# Patient Record
Sex: Female | Born: 1985 | Race: Black or African American | Hispanic: No | Marital: Single | Smoking: Never smoker
Health system: Southern US, Community
[De-identification: ages and names within clinical notes are randomized; demographics above are authoritative.]

## PROBLEM LIST (undated history)

## (undated) DIAGNOSIS — Z8489 Family history of other specified conditions: Secondary | ICD-10-CM

## (undated) DIAGNOSIS — N390 Urinary tract infection, site not specified: Secondary | ICD-10-CM

## (undated) HISTORY — PX: WISDOM TOOTH EXTRACTION: SHX21

---

## 2009-03-16 ENCOUNTER — Inpatient Hospital Stay (HOSPITAL_COMMUNITY): Admission: AD | Admit: 2009-03-16 | Discharge: 2009-03-18 | Payer: Self-pay | Admitting: Obstetrics and Gynecology

## 2010-07-13 LAB — CBC
HCT: 43.6 % (ref 36.0–46.0)
Hemoglobin: 14.7 g/dL (ref 12.0–15.0)
MCHC: 33.5 g/dL (ref 30.0–36.0)
MCHC: 33.8 g/dL (ref 30.0–36.0)
MCV: 96.4 fL (ref 78.0–100.0)
MCV: 96.5 fL (ref 78.0–100.0)
Platelets: 156 10*3/uL (ref 150–400)
RDW: 13.9 % (ref 11.5–15.5)

## 2015-04-07 ENCOUNTER — Emergency Department (HOSPITAL_COMMUNITY)
Admission: EM | Admit: 2015-04-07 | Discharge: 2015-04-07 | Disposition: A | Discharge status: Home / Self Care | Payer: 59 | Attending: Emergency Medicine | Admitting: Emergency Medicine

## 2015-04-07 ENCOUNTER — Emergency Department (HOSPITAL_COMMUNITY): Payer: 59

## 2015-04-07 ENCOUNTER — Encounter (HOSPITAL_COMMUNITY): Payer: Self-pay | Admitting: Emergency Medicine

## 2015-04-07 DIAGNOSIS — Y9389 Activity, other specified: Secondary | ICD-10-CM | POA: Insufficient documentation

## 2015-04-07 DIAGNOSIS — S43402A Unspecified sprain of left shoulder joint, initial encounter: Secondary | ICD-10-CM | POA: Diagnosis not present

## 2015-04-07 DIAGNOSIS — Y998 Other external cause status: Secondary | ICD-10-CM | POA: Insufficient documentation

## 2015-04-07 DIAGNOSIS — Y92410 Unspecified street and highway as the place of occurrence of the external cause: Secondary | ICD-10-CM | POA: Insufficient documentation

## 2015-04-07 DIAGNOSIS — S199XXA Unspecified injury of neck, initial encounter: Secondary | ICD-10-CM | POA: Diagnosis present

## 2015-04-07 DIAGNOSIS — Z880 Allergy status to penicillin: Secondary | ICD-10-CM | POA: Insufficient documentation

## 2015-04-07 DIAGNOSIS — S161XXA Strain of muscle, fascia and tendon at neck level, initial encounter: Secondary | ICD-10-CM | POA: Insufficient documentation

## 2015-04-07 MED ORDER — METHOCARBAMOL 500 MG PO TABS: 500.0000 mg | ORAL_TABLET | Freq: Two times a day (BID) | ORAL | Status: DC

## 2015-04-07 MED ORDER — ACETAMINOPHEN 500 MG PO TABS
1000.0000 mg | ORAL_TABLET | Freq: Once | ORAL | Status: AC
  Administered 2015-04-07: 1000 mg via ORAL
  Filled 2015-04-07: qty 2
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
## 2015-04-07 NOTE — Discharge Instructions (Signed)
For pain control you may take up to  of Motrin (also known as ibuprofen). That is usually 4 over the counter pills,  3 times a day. Take with food to minimize stomach irritation   You can also take  tylenol (acetaminophen)  (this is 3 over the counter pills) four times a day. Do not drink alcohol or combine with other medications that have acetaminophen as an ingredient (Read the labels!).    For breakthrough pain you may take Robaxin. Do not drink alcohol, drive or operate heavy machinery when taking Robaxin.  Do not hesitate to return to the emergency room for any new, worsening or concerning symptoms.  Please obtain primary care using resource guide below. Let them know that you were seen in the emergency room and that they will need to obtain records for further outpatient management.   Cervical Sprain A cervical sprain is an injury in the neck in which the strong, fibrous tissues (ligaments) that connect your neck bones stretch or tear. Cervical sprains can range from mild to severe. Severe cervical sprains can cause the neck vertebrae to be unstable. This can lead to damage of the spinal cord and can result in serious nervous system problems. The amount of time it takes for a cervical sprain to get better depends on the cause and extent of the injury. Most cervical sprains heal in 1 to 3 weeks. CAUSES  Severe cervical sprains may be caused by:   Contact sport injuries (such as from football, rugby, wrestling, hockey, auto racing, gymnastics, diving, martial arts, or boxing).   Motor vehicle collisions.   Whiplash injuries. This is an injury from a sudden forward and backward whipping movement of the head and neck.  Falls.  Mild cervical sprains may be caused by:   Being in an awkward position, such as while cradling a telephone between your ear and shoulder.   Sitting in a chair that does not offer proper support.   Working at a poorly Marketing executive station.    Looking up or down for long periods of time.  SYMPTOMS   Pain, soreness, stiffness, or a burning sensation in the front, back, or sides of the neck. This discomfort may develop immediately after the injury or slowly, 24 hours or more after the injury.   Pain or tenderness directly in the middle of the back of the neck.   Shoulder or upper back pain.   Limited ability to move the neck.   Headache.   Dizziness.   Weakness, numbness, or tingling in the hands or arms.   Muscle spasms.   Difficulty swallowing or chewing.   Tenderness and swelling of the neck.  DIAGNOSIS  Most of the time your health care provider can diagnose a cervical sprain by taking your history and doing a physical exam. Your health care provider will ask about previous neck injuries and any known neck problems, such as arthritis in the neck. X-rays may be taken to find out if there are any other problems, such as with the bones of the neck. Other tests, such as a CT scan or MRI, may also be needed.  TREATMENT  Treatment depends on the severity of the cervical sprain. Mild sprains can be treated with rest, keeping the neck in place (immobilization), and pain medicines. Severe cervical sprains are immediately immobilized. Further treatment is done to help with pain, muscle spasms, and other symptoms and may include:  Medicines, such as pain relievers, numbing medicines, or muscle relaxants.  Physical therapy. This may involve stretching exercises, strengthening exercises, and posture training. Exercises and improved posture can help stabilize the neck, strengthen muscles, and help stop symptoms from returning.  HOME CARE INSTRUCTIONS   Put ice on the injured area.   Put ice in a plastic bag.   Place a towel between your skin and the bag.   Leave the ice on for 15-20 minutes, 3-4 times a day.   If your injury was severe, you may have been given a cervical collar to wear. A cervical collar  is a two-piece collar designed to keep your neck from moving while it heals.  Do not remove the collar unless instructed by your health care provider.  If you have long hair, keep it outside of the collar.  Ask your health care provider before making any adjustments to your collar. Minor adjustments may be required over time to improve comfort and reduce pressure on your chin or on the back of your head.  Ifyou are allowed to remove the collar for cleaning or bathing, follow your health care provider's instructions on how to do so safely.  Keep your collar clean by wiping it with mild soap and water and drying it completely. If the collar you have been given includes removable pads, remove them every 1-2 days and hand wash them with soap and water. Allow them to air dry. They should be completely dry before you wear them in the collar.  If you are allowed to remove the collar for cleaning and bathing, wash and dry the skin of your neck. Check your skin for irritation or sores. If you see any, tell your health care provider.  Do not drive while wearing the collar.   Only take over-the-counter or prescription medicines for pain, discomfort, or fever as directed by your health care provider.   Keep all follow-up appointments as directed by your health care provider.   Keep all physical therapy appointments as directed by your health care provider.   Make any needed adjustments to your workstation to promote good posture.   Avoid positions and activities that make your symptoms worse.   Warm up and stretch before being active to help prevent problems.  SEEK MEDICAL CARE IF:   Your pain is not controlled with medicine.   You are unable to decrease your pain medicine over time as planned.   Your activity level is not improving as expected.  SEEK IMMEDIATE MEDICAL CARE IF:   You develop any bleeding.  You develop stomach upset.  You have signs of an allergic reaction to  your medicine.   Your symptoms get worse.   You develop new, unexplained symptoms.   You have numbness, tingling, weakness, or paralysis in any part of your body.  MAKE SURE YOU:   Understand these instructions.  Will watch your condition.  Will get help right away if you are not doing well or get worse.   This information is not intended to replace advice given to you by your health care provider. Make sure you discuss any questions you have with your health care provider.   Document Released: 01/23/2007 Document Revised: 04/02/2013 Document Reviewed: 10/03/2012 Elsevier Interactive Patient Education 2016 [employer].   Emergency Department Resource Guide 1) Find a Doctor and Pay Out of Pocket Although you won't have to find out who is covered by your insurance plan, it is a good idea to ask around and get recommendations. You will then need to call the office  and see if the doctor you have chosen will accept you as a new patient and what types of options they offer for patients who are self-pay. Some doctors offer discounts or will set up payment plans for their patients who do not have insurance, but you will need to ask so you aren't surprised when you get to your appointment.  2) Contact Your Local Health Department Not all health departments have doctors that can see patients for sick visits, but many do, so it is worth a call to see if yours does. If you don't know where your local health department is, you can check in your phone book. The CDC also has a tool to help you locate your state's health department, and many state websites also have listings of all of their local health departments.  3) Find a Walk-in Clinic If your illness is not likely to be very severe or complicated, you may want to try a walk in clinic. These are popping up all over the country in pharmacies, drugstores, and shopping centers. They're usually staffed by nurse practitioners or physician  assistants that have been trained to treat common illnesses and complaints. They're usually fairly quick and inexpensive. However, if you have serious medical issues or chronic medical problems, these are probably not your best option.  No Primary Care Doctor: - Call Health Connect at  [phone] - they can help you locate a primary care doctor that  accepts your insurance, provides certain services, etc. - Physician Referral Service- [phone]  Chronic Pain Problems: Organization         Address  Phone   Notes  [facility]  [phone] Patients need to be referred by their primary care doctor.   Medication Assistance: Organization         Address  Phone   Notes  [facility] Medication [facility] [address] [place] [place], [place] [address] [phone] --Must be a resident of [facility] -- Must have NO insurance coverage whatsoever (no Medicaid/ Medicare, etc.) -- The pt. MUST have a primary care doctor that directs their care regularly and follows them in the community   [facility]  [phone]   [employer]  [phone]    Agencies that provide inexpensive medical care: Organization         Address  Phone   Notes  [facility]  [phone]   [facility]    [phone]   [facility] [address] [place], [place] [address] [phone]   [facility] [address] [place], [place] [phone]   Planned Parenthood    [phone]   [facility]    [phone]   [facility]  [address], [place] Phone:  [phone], Fax:  [phone] Hours of Operation:  9 am - 6 pm, M-F.  Also accepts Medicaid/Medicare and self-pay.  [facility]  [address], [place] Phone: [phone], Fax: [phone]. Hours of Operation:  8:30 am - 5:30 pm, M-F.  Also accepts  Medicaid and self-pay.  [facility] [place] [address], [place] Phone: [phone]   [facility] [address] Natasha Bence [place], [place] [phone], Ext. [phone] Mondays & Thursdays: 7-9 AM.  First 15 patients are seen on a first come, first serve basis.    Medicaid-accepting [place] Providers:  Organization         Address  Phone   Notes  [facility] [address], [place] [phone] Also accepts self-pay patients.  [facility] [address] Laurell Josephs [place], [place]  [phone]   [facility] [address], [place] [phone]   [facility] [address], [place] [phone]   Renaye Rakers [address], [place]   [phone] Only accepts [place] patients after they have their name applied to their card.   Self-Pay (no insurance) in [facility]:  Organization         Address  Phone   Notes  Sickle Cell Patients, [facility] [address] [place], [place] [phone]   [facility] [address] [place], [place] [phone]   [facility] [place]  [address], [place] [phone]   [facility]/Dr. Osei-Bonsu  [address], [place] or [address], [place] [phone] Phone number for both [place] and [place] locations is the same.  [facility]gent Medical and [facility] [address] [place] [phone]   [facility] [address], [place] or [address] [phone]   [facility] [address] [place] [phone], phone; [phone], fax Sees patients 1st and 3rd Saturday of every month.  Must not qualify for public or private insurance (i.e. Medicaid, Medicare, [place] Health Choice, Veterans' Benefits)  Household  income should be no more than 200% of the poverty level The clinic cannot treat you if you are pregnant or think you are pregnant  Sexually transmitted diseases are not treated at the clinic.    Dental Care: Organization         Address  Phone  Notes  [facility] of [facility] [address] [place], [place] [phone] Accepts children up to age 65 who are enrolled in [place] or [place] Health Choice; pregnant women with a Medicaid card; and children who have applied for Medicaid or [place] Health Choice, but were declined, whose parents can pay a reduced fee at time of service.  [facility] Department of [facility]  [address], [place] [phone] Accepts children up to age 81 who are enrolled in [place] or [place] Health Choice; pregnant women with a Medicaid card; and children who have applied for Medicaid or [place] Health Choice, but were declined, whose parents can pay a reduced fee at time of service.  [facility]  [address] [place], [place] [phone] Patients are seen by appointment only. Walk-ins are not accepted. [facility] will see patients 63 years of age and older. Monday - Tuesday (8am-5pm) Most Wednesdays (8:30-5pm) $30 per visit, cash only  [facility]  [address], [facility] [phone] Patients are seen by appointment only. Walk-ins are not accepted. [facility] will see patients 23 years of age and older. One Wednesday Evening (Monthly: Volunteer Based).  $30 per visit, cash only  [employer]  [phone] for adults; Children under age 42, call Graduate Pediatric Dentistry at [phone]. Children aged 65-14, please call [phone] to request a pediatric application.  Dental services are provided in all areas of dental care including fillings, crowns and bridges, complete and partial dentures, implants, gum  treatment, root canals, and extractions. Preventive care is also provided. Treatment is provided to both adults and children. Patients are selected via a lottery and there is often a waiting list.   [facility] [address], [place]  [phone] [email or web address]   [facility] [address], [place], [place] [phone], Ext. [phone] Second and Fourth Thursday of each month, opens at 6:30 AM; Clinic ends at 9 AM.  Patients are seen on a first-come first-served basis, and a limited number are seen during each clinic.   [facility]  [address], [place], [place] [phone]   Eligibility Requirements You must have lived in [place], [place], or [place] counties for at least the last three months.   You cannot be eligible for state or federal sponsored [place]healthcare insurance, including CIGNAVeterans Administration, [place]icaid, or [employer]icare.   You generally cannot be eligible for healthcare insurance through your employer.    How to apply: Eligibility screenings are held every Tuesday and Wednesday afternoon from 1:00 pm until 4:00 pm. You do not need an appointment for the interview!  [facility] [address], [place], [place] [phone]   [facility]  [phone]   [facility]  [phone]   [facility]  [phone]    Behavioral Health Resources in the Community: Intensive Outpatient Programs Organization         Address  Phone  Notes  [facility] [address], [place], [place] [phone]   [facility] [address], [place], [place] [phone]   [facility] [address], [place], [place]  [phone]   [facility] [address],  [place], [place] [phone] or [phone]   Substance Abuse Resources Organization         Address  Phone  Notes  Alcohol and  Drug Services  [phone]   [facility]  [phone]   [facility]  [phone]   [facility]  [phone]   Residential & Outpatient Substance Abuse Program  [phone]   Psychological Services Organization         Address  Phone  Notes  [facility]  [phone]- [phone]   [facility]  [phone]- [phone]   [facility] [address], [place] [phone] or [phone]    Mobile Crisis Teams Organization         Address  Phone  Notes  [facility], Mobile Crisis Care Unit  [phone]   [facility]  [address] [place], [place] [phone]   Doristine LocksSharon DeEsch [address] [place] [phone]    Self-Help/Support Groups Organization         Address  Phone             Notes  [facility] [place] - variety of support groups  336- [phone] Call for more information  Narcotics Anonymous (NA), [facility] [address], [place]  2 meetings at this location   Statisticianesidential Treatment Programs Organization         Address  Phone  Notes  [facility] [address],    [place]  [phone]   [facility]  [address], [place] [phone], [place], [place]  [phone]   [facility] [address] [place], [place] [phone] Admissions: 8am-3pm M-F  [facility] [address]    [place], [place] [phone]   [facility] [address] [place], [place], [place] [phone]   [facility] [address],  [place], [place] [phone]   [facility] - Intensive Outpatient [address] Laurell Josephs [address], [place], [place] [phone]   [facility] [address] [place]  [place], [place] [phone] or [phone]   [facility] ([facility]) [address] [place], [place] [phone] Accepts Medicaid  [facility]ship  [place] [address]  [place] [phone] Substance Abuse/Addiction Treatment   [facility] Organization         Address  Phone  Notes  [facility]  [phone]   Angie Fava, PhD [address] Ervin Knack [place], [place]   [phone] or [phone]   [facility]   [address] [place], [place] [phone]   [facility] [address] [place], [place] [phone] Insurance/Medicaid/sponsorship through [facility] and Families [address]                                    [place], [place] [phone] Therapy/tele-psych/case  [facility] [address], [place] [phone]    Dr. Lolly Mustache  [phone]   [facility]  [employer] [facility] Dept. 1) [address] [place] 2) [address] [place] 3)  [address], [place] [phone]  [phone]   [facility] Child Abuse Hotline 316-382-5951 or [phone] (After Hours)

## 2016-08-18 ENCOUNTER — Emergency Department (HOSPITAL_COMMUNITY)
Admission: EM | Admit: 2016-08-18 | Discharge: 2016-08-18 | Disposition: A | Discharge status: Home / Self Care | Payer: 59 | Attending: Emergency Medicine | Admitting: Emergency Medicine

## 2016-08-18 DIAGNOSIS — J020 Streptococcal pharyngitis: Secondary | ICD-10-CM

## 2016-08-18 DIAGNOSIS — J028 Acute pharyngitis due to other specified organisms: Secondary | ICD-10-CM | POA: Diagnosis not present

## 2016-08-18 DIAGNOSIS — B955 Unspecified streptococcus as the cause of diseases classified elsewhere: Secondary | ICD-10-CM | POA: Insufficient documentation

## 2016-08-18 DIAGNOSIS — J029 Acute pharyngitis, unspecified: Secondary | ICD-10-CM | POA: Diagnosis present

## 2016-08-18 LAB — RAPID STREP SCREEN (MED CTR MEBANE ONLY): Streptococcus, Group A Screen (Direct): POSITIVE — AB

## 2016-08-18 MED ORDER — MAGIC MOUTHWASH
5.0000 mL | Freq: Once | ORAL | Status: AC
  Administered 2016-08-18: 5 mL via ORAL
  Filled 2016-08-18: qty 5

## 2016-08-18 MED ORDER — AZITHROMYCIN 250 MG PO TABS: 250.0000 mg | ORAL_TABLET | Freq: Every day | ORAL | 0 refills | Status: DC

## 2016-08-18 MED ORDER — IBUPROFEN 400 MG PO TABS
600.0000 mg | ORAL_TABLET | Freq: Once | ORAL | Status: AC
  Administered 2016-08-18: 600 mg via ORAL
  Filled 2016-08-18: qty 1

## 2016-08-18 MED ORDER — IBUPROFEN 600 MG PO TABS: 600.0000 mg | ORAL_TABLET | Freq: Four times a day (QID) | ORAL | 0 refills | Status: DC | PRN

## 2016-08-18 MED ORDER — DEXAMETHASONE SODIUM PHOSPHATE 10 MG/ML IJ SOLN
10.0000 mg | Freq: Once | INTRAMUSCULAR | Status: AC
  Administered 2016-08-18: 10 mg via INTRAVENOUS
  Filled 2016-08-18: qty 1

## 2016-08-18 NOTE — Discharge Instructions (Signed)
You have a strep infection, treatment will be to finish the antibiotics.  Please return to the ER if your symptoms worsen; you have increased pain, fevers, chills, inability to keep any medications down, difficulty breathing. Otherwise see the outpatient doctor as requested.

## 2016-08-18 NOTE — ED Notes (Signed)
ED Provider at bedside. 

## 2016-08-22 NOTE — ED Provider Notes (Signed)
[facility]-EMERGENCY DEPT Provider Note   CSN: [id number] Arrival date & time: 08/18/16  0225     History   Chief Complaint No chief complaint on file.   HPI Linda Richards is a 31 y.o. female.  HPI SUBJECTIVE:  Linda Richards is a 31 y.o. female who complains of sore throat for 2 days. She denies a history of shortness of breath, vomiting, cough and sputum production and denies a history of asthma. Sore throat is worse with swallowing. No drooling, headaches, confusion and pain.   No past medical history on file.  There are no active problems to display for this patient.   No past surgical history on file.  OB History    No data available       Home Medications    Prior to Admission medications   Medication Sig Start Date End Date Taking? Authorizing Provider  azithromycin (ZITHROMAX) 250 MG tablet Take 1 tablet (250 mg total) by mouth daily. Take first 2 tablets together, then 1 every day until finished. 08/18/16   Derwood KaplanNanavati, Joniah Bednarski, MD  ibuprofen (ADVIL,MOTRIN) 600 MG tablet Take 1 tablet (600 mg total) by mouth every 6 (six) hours as needed. 08/18/16   Derwood KaplanNanavati, Naelani Lafrance, MD  methocarbamol (ROBAXIN) 500 MG tablet Take 1 tablet (500 mg total) by mouth 2 (two) times daily. 04/07/15   Pisciotta, Mardella LaymanNicole, PA-C    Family History No family history on file.  Social History Social History  Substance Use Topics  . Smoking status: Never Smoker  . Smokeless tobacco: Not on file  . Alcohol use No     Allergies   Penicillins   Review of Systems Review of Systems  Constitutional: Positive for activity change.  HENT: Positive for sore throat.   Allergic/Immunologic: Negative for immunocompromised state.  Neurological: Negative for weakness.     Physical Exam Updated Vital Signs BP 123/82   Pulse 89   SpO2 98%   Physical Exam  Constitutional: She is oriented to person, place, and time. She appears well-developed and well-nourished.  HENT:  Head: Normocephalic and  atraumatic.  Mouth/Throat: Oropharyngeal exudate present.  Pt has no trismus, stridor or crepitus over the neck. Pt has xxx tonsillar enlargement and exudates. Pharynx is erythematous.   Eyes: EOM are normal. Pupils are equal, round, and reactive to light.  Neck: Neck supple.  Cardiovascular: Normal rate, regular rhythm and normal heart sounds.   No murmur heard. Pulmonary/Chest: Effort normal. No respiratory distress.  Abdominal: Soft. She exhibits no distension. There is no tenderness. There is no rebound and no guarding.  Neurological: She is alert and oriented to person, place, and time.  Skin: Skin is warm and dry.  Nursing note and vitals reviewed.    ED Treatments / Results  Labs (all labs ordered are listed, but only abnormal results are displayed) Labs Reviewed  RAPID STRE[facility] SCREEN ([facility]) - Abnormal; Notable for the following:       Result Value   Streptococcus, Group A Screen (Direct) POSITIVE (*)    All other components within normal limits    EKG  EKG Interpretation None       Radiology No results found.  Procedures Procedures (including critical care time)  Medications Ordered in ED Medications  dexamethasone (DECADRON) injection 10 mg (10 mg Intravenous Given 08/18/16 0651)  ibuprofen (ADVIL,MOTRIN) tablet 600 mg (600 mg Oral Given 08/18/16 0651)  magic mouthwash (5 mLs Oral Given 08/18/16 [id number])     Initial Impression / Assessment and Plan /  ED Course  I have reviewed the triage vital signs and the nursing notes.  Pertinent labs & imaging results that were available during my care of the patient were reviewed by me and considered in my medical decision making (see chart for details).     Pt has strep pharyngitis based on exam and results. No signs of deep space infection.  Final Clinical Impressions(s) / ED Diagnoses   Final diagnoses:  Acute streptococcal pharyngitis    New Prescriptions Discharge Medication List as of 08/18/2016  7:01  AM    START taking these medications   Details  azithromycin (ZITHROMAX) 250 MG tablet Take 1 tablet (250 mg total) by mouth daily. Take first 2 tablets together, then 1 every day until finished., Starting Thu 08/18/2016, Print    ibuprofen (ADVIL,MOTRIN) 600 MG tablet Take 1 tablet (600 mg total) by mouth every 6 (six) hours as needed., Starting Thu 08/18/2016, Print         Derwood Kaplan, MD 08/22/16 1818
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
## 2018-11-26 ENCOUNTER — Encounter: Payer: Self-pay | Admitting: Internal Medicine

## 2018-11-26 ENCOUNTER — Ambulatory Visit (INDEPENDENT_AMBULATORY_CARE_PROVIDER_SITE_OTHER): Payer: 59 | Admitting: Internal Medicine

## 2018-11-26 ENCOUNTER — Other Ambulatory Visit: Payer: Self-pay

## 2018-11-26 VITALS — BP 112/66 | HR 77 | Temp 98.2°F | Ht 63.4 in | Wt 212.4 lb

## 2018-11-26 DIAGNOSIS — Z0000 Encounter for general adult medical examination without abnormal findings: Secondary | ICD-10-CM

## 2018-11-26 LAB — POCT URINALYSIS DIPSTICK
Bilirubin, UA: NEGATIVE
Blood, UA: NEGATIVE
Glucose, UA: NEGATIVE
Ketones, UA: NEGATIVE
Leukocytes, UA: NEGATIVE
Nitrite, UA: NEGATIVE
Protein, UA: NEGATIVE
Spec Grav, UA: 1.02 (ref 1.010–1.025)
Urobilinogen, UA: 0.2 E.U./dL
pH, UA: 5.5 (ref 5.0–8.0)
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
## 2018-11-26 NOTE — Patient Instructions (Signed)
Health Maintenance, Female Adopting a healthy lifestyle and getting preventive care are important in promoting health and wellness. Ask your health care provider about:  The right schedule for you to have regular tests and exams.  Things you can do on your own to prevent diseases and keep yourself healthy. What should I know about diet, weight, and exercise? Eat a healthy diet   Eat a diet that includes plenty of vegetables, fruits, low-fat dairy products, and lean protein.  Do not eat a lot of foods that are high in solid fats, added sugars, or sodium. Maintain a healthy weight Body mass index (BMI) is used to identify weight problems. It estimates body fat based on height and weight. Your health care provider can help determine your BMI and help you achieve or maintain a healthy weight. Get regular exercise Get regular exercise. This is one of the most important things you can do for your health. Most adults should:  Exercise for at least 150 minutes each week. The exercise should increase your heart rate and make you sweat (moderate-intensity exercise).  Do strengthening exercises at least twice a week. This is in addition to the moderate-intensity exercise.  Spend less time sitting. Even light physical activity can be beneficial. Watch cholesterol and blood lipids Have your blood tested for lipids and cholesterol at 32 years of age, then have this test every 5 years. Have your cholesterol levels checked more often if:  Your lipid or cholesterol levels are high.  You are older than 32 years of age.  You are at high risk for heart disease. What should I know about cancer screening? Depending on your health history and family history, you may need to have cancer screening at various ages. This may include screening for:  Breast cancer.  Cervical cancer.  Colorectal cancer.  Skin cancer.  Lung cancer. What should I know about heart disease, diabetes, and high blood  pressure? Blood pressure and heart disease  High blood pressure causes heart disease and increases the risk of stroke. This is more likely to develop in people who have high blood pressure readings, are of African descent, or are overweight.  Have your blood pressure checked: ? Every 3-5 years if you are 18-39 years of age. ? Every year if you are 40 years old or older. Diabetes Have regular diabetes screenings. This checks your fasting blood sugar level. Have the screening done:  Once every three years after age 40 if you are at a normal weight and have a low risk for diabetes.  More often and at a younger age if you are overweight or have a high risk for diabetes. What should I know about preventing infection? Hepatitis B If you have a higher risk for hepatitis B, you should be screened for this virus. Talk with your health care provider to find out if you are at risk for hepatitis B infection. Hepatitis C Testing is recommended for:  Everyone born from 1945 through 1965.  Anyone with known risk factors for hepatitis C. Sexually transmitted infections (STIs)  Get screened for STIs, including gonorrhea and chlamydia, if: ? You are sexually active and are younger than 32 years of age. ? You are older than 32 years of age and your health care provider tells you that you are at risk for this type of infection. ? Your sexual activity has changed since you were last screened, and you are at increased risk for chlamydia or gonorrhea. Ask your health care provider if   you are at risk.  Ask your health care provider about whether you are at high risk for HIV. Your health care provider may recommend a prescription medicine to help prevent HIV infection. If you choose to take medicine to prevent HIV, you should first get tested for HIV. You should then be tested every 3 months for as long as you are taking the medicine. Pregnancy  If you are about to stop having your period (premenopausal) and  you may become pregnant, seek counseling before you get pregnant.  Take 400 to 800 micrograms (mcg) of folic acid every day if you become pregnant.  Ask for birth control (contraception) if you want to prevent pregnancy. Osteoporosis and menopause Osteoporosis is a disease in which the bones lose minerals and strength with aging. This can result in bone fractures. If you are 65 years old or older, or if you are at risk for osteoporosis and fractures, ask your health care provider if you should:  Be screened for bone loss.  Take a calcium or vitamin D supplement to lower your risk of fractures.  Be given hormone replacement therapy (HRT) to treat symptoms of menopause. Follow these instructions at home: Lifestyle  Do not use any products that contain nicotine or tobacco, such as cigarettes, e-cigarettes, and chewing tobacco. If you need help quitting, ask your health care provider.  Do not use street drugs.  Do not share needles.  Ask your health care provider for help if you need support or information about quitting drugs. Alcohol use  Do not drink alcohol if: ? Your health care provider tells you not to drink. ? You are pregnant, may be pregnant, or are planning to become pregnant.  If you drink alcohol: ? Limit how much you use to 0-1 drink a day. ? Limit intake if you are breastfeeding.  Be aware of how much alcohol is in your drink. In the [place] one drink equals one 12 oz bottle of beer (355 mL), one 5 oz glass of wine (148 mL), or one 1 oz glass of hard liquor (44 mL). General instructions  Schedule regular health, dental, and eye exams.  Stay current with your vaccines.  Tell your health care provider if: ? You often feel depressed. ? You have ever been abused or do not feel safe at home. Summary  Adopting a healthy lifestyle and getting preventive care are important in promoting health and wellness.  Follow your health care provider's instructions about healthy  diet, exercising, and getting tested or screened for diseases.  Follow your health care provider's instructions on monitoring your cholesterol and blood pressure. This information is not intended to replace advice given to you by your health care provider. Make sure you discuss any questions you have with your health care provider. Document Released: 10/11/2010 Document Revised: 03/21/2018 Document Reviewed: 03/21/2018 Elsevier Patient Education  2020 [employer] Inc.  

## 2018-11-27 LAB — CBC
Hematocrit: 41.5 % (ref 34.0–46.6)
Hemoglobin: 14.7 g/dL (ref 11.1–15.9)
MCH: 32.3 pg (ref 26.6–33.0)
MCHC: 35.4 g/dL (ref 31.5–35.7)
MCV: 91 fL (ref 79–97)
Platelets: 245 10*3/uL (ref 150–450)
RBC: 4.55 x10E6/uL (ref 3.77–5.28)
RDW: 12.5 % (ref 11.7–15.4)
WBC: 7.5 10*3/uL (ref 3.4–10.8)

## 2018-11-27 LAB — CMP14+EGFR
ALT: 18 IU/L (ref 0–32)
AST: 23 IU/L (ref 0–40)
Albumin/Globulin Ratio: 1.4 (ref 1.2–2.2)
Albumin: 4.2 g/dL (ref 3.8–4.8)
Alkaline Phosphatase: 55 IU/L (ref 39–117)
BUN/Creatinine Ratio: 10 (ref 9–23)
BUN: 12 mg/dL (ref 6–20)
Bilirubin Total: 0.5 mg/dL (ref 0.0–1.2)
CO2: 21 mmol/L (ref 20–29)
Calcium: 9.4 mg/dL (ref 8.7–10.2)
Chloride: 101 mmol/L (ref 96–106)
Creatinine, Ser: 1.16 mg/dL — ABNORMAL HIGH (ref 0.57–1.00)
GFR calc Af Amer: 72 mL/min/{1.73_m2} (ref >59)
GFR calc non Af Amer: 62 mL/min/{1.73_m2} (ref >59)
Globulin, Total: 2.9 g/dL (ref 1.5–4.5)
Glucose: 77 mg/dL (ref 65–99)
Potassium: 3.7 mmol/L (ref 3.5–5.2)
Sodium: 138 mmol/L (ref 134–144)
Total Protein: 7.1 g/dL (ref 6.0–8.5)

## 2018-11-27 LAB — LIPID PANEL
Chol/HDL Ratio: 3.6 ratio (ref 0.0–4.4)
Cholesterol, Total: 167 mg/dL (ref 100–199)
HDL: 47 mg/dL (ref >39)
LDL Calculated: 97 mg/dL (ref 0–99)
Triglycerides: 114 mg/dL (ref 0–149)
VLDL Cholesterol Cal: 23 mg/dL (ref 5–40)

## 2018-11-27 LAB — HEMOGLOBIN A1C
Est. average glucose Bld gHb Est-mCnc: 111 mg/dL
Hgb A1c MFr Bld: 5.5 % (ref 4.8–5.6)

## 2018-11-27 LAB — VITAMIN D 25 HYDROXY (VIT D DEFICIENCY, FRACTURES): Vit D, 25-Hydroxy: 39.1 ng/mL (ref 30.0–100.0)

## 2018-11-27 LAB — TSH: TSH: 3.49 u[IU]/mL (ref 0.450–4.500)

## 2019-06-02 ENCOUNTER — Emergency Department (HOSPITAL_COMMUNITY)
Admission: EM | Admit: 2019-06-02 | Discharge: 2019-06-02 | Disposition: A | Discharge status: Home / Self Care | Payer: No Typology Code available for payment source | Attending: Emergency Medicine | Admitting: Emergency Medicine

## 2019-06-02 ENCOUNTER — Other Ambulatory Visit: Payer: Self-pay

## 2019-06-02 DIAGNOSIS — R2241 Localized swelling, mass and lump, right lower limb: Secondary | ICD-10-CM | POA: Insufficient documentation

## 2019-06-02 DIAGNOSIS — M79604 Pain in right leg: Secondary | ICD-10-CM | POA: Insufficient documentation

## 2019-06-02 LAB — D-DIMER, QUANTITATIVE: D-Dimer, Quant: 0.31 ug/mL-FEU (ref 0.00–0.50)

## 2019-06-02 NOTE — Discharge Instructions (Signed)
It was a pleasure meeting you tonight. Fortunately, your D-dimer returned and was negative indicating that your leg pain is very unlikely to be related to a blood clot. As we discussed, you can try ibuprofen, tylenol and heat on your leg if you are experiencing pain. If you develop swelling, worse pain, or redness of your leg, please be re-evaluated.

## 2019-06-02 NOTE — ED Provider Notes (Signed)
[facility] EMERGENCY DEPARTMENT Provider Note   CSN: [id number] Arrival date & time: 06/02/19  0217     History Chief Complaint  Patient presents with  . Leg Pain    Linda Richards is a 34 y.o. female with no significant past medical history who is presenting tonight for right leg pain.  She states that it started in her thigh yesterday and migrated down to her calf today.  The thigh pains no longer present.   She denies any recent trauma to the leg.  No prior history of blood clots.  Her father did have a DVT and pulmonary embolism in his 36s that was unprovoked.  Not currently taking any medications. She denies cough or shortness of breath.  No past medical history on file.  There are no problems to display for this patient.   No past surgical history on file.   OB History   No obstetric history on file.     Family History  Problem Relation Age of Onset  . Diabetes Mother   . Diabetes Father   . Hypertension Father     Social History   Tobacco Use  . Smoking status: Never Smoker  . Smokeless tobacco: Never Used  Substance Use Topics  . Alcohol use: Yes    Comment: social  . Drug use: No    Home Medications Prior to Admission medications   Not on File    Allergies    Penicillins  Review of Systems   Review of Systems  Constitutional: Negative.   HENT: Negative.   Respiratory: Negative for cough and shortness of breath.   Cardiovascular: Positive for leg swelling. Negative for chest pain.  Gastrointestinal: Negative.   Genitourinary: Negative.   Musculoskeletal:       Right lower leg pain  Skin: Negative.   Neurological: Negative.   Psychiatric/Behavioral: Negative.     Physical Exam Updated Vital Signs BP 119/80 (BP Location: Right Arm)   Pulse 98   Temp 98.5 F (36.9 C) (Oral)   Resp 18   SpO2 98%   Physical Exam Constitutional:      General: She is not in acute distress. HENT:     Head: Atraumatic.    Cardiovascular:     Rate and Rhythm: Normal rate and regular rhythm.     Comments: Pedal pulses intact.  Pulmonary:     Effort: Pulmonary effort is normal. No respiratory distress.     Breath sounds: Normal breath sounds.  Abdominal:     General: Bowel sounds are normal.  Musculoskeletal:     Comments: Mild lower extremity non-pitting edema bilaterally. Pain with palpation over the right calf. Negative homan's sign.  Skin:    General: Skin is warm and dry.     Findings: No bruising or erythema.  Neurological:     General: No focal deficit present.     Mental Status: She is alert.  Psychiatric:        Mood and Affect: Mood normal.     ED Results / Procedures / Treatments   Labs (all labs ordered are listed, but only abnormal results are displayed) Labs Reviewed  D-DIMER, QUANTITATIVE (NOT AT [facility])    EKG None  Radiology No results found.  Medications Ordered in ED Medications - No data to display  ED Course  I have reviewed the triage vital signs and the nursing notes.  Pertinent labs & imaging results that were available during my care of the patient were reviewed  by me and considered in my medical decision making (see chart for details).    MDM Rules/Calculators/A&P  34 year old healthy female with no significant past medical history who is presenting with 1 day history of right leg pain.  Naturally, DVT is of concern. Her FH of her father having an unprovoked clot in his 44s contributes as well. She has no history of clot herself. Low probability based on Well's score of 1. Will obtain a D-dimer. If negative, will likely be able to discharge with conservative measures. Other consideration would be an arterial occlusion however pedal pulses are present so very unlikely. No trauma or activity out of her routine to raise concern for an MSK or compartment syndrome.  4:16 AM D-dimer 0.31. Discussed result and discharge with patient. Educated regarding conservative  measures. She implies agreement with the plan and has no further questions.  Final Clinical Impression(s) / ED Diagnoses Final diagnoses:  Right leg pain    Rx / DC Orders ED Discharge Orders    None       Mitzi Hansen, MD 06/02/19 2094    Merrily Pew, MD 06/02/19 [phone]

## 2019-06-02 NOTE — ED Notes (Signed)
Patient verbalizes understanding of discharge instructions. Opportunity for questioning and answers were provided. Armband removed by staff, pt discharged from ED.  

## 2019-06-02 NOTE — ED Triage Notes (Signed)
Per pt she has been having right leg pain x 2 days. Pt said no swelling or redness just a tender area that started in her thigh and now in the right calf.

## 2019-12-02 ENCOUNTER — Other Ambulatory Visit: Payer: Self-pay

## 2019-12-02 ENCOUNTER — Ambulatory Visit (INDEPENDENT_AMBULATORY_CARE_PROVIDER_SITE_OTHER): Payer: No Typology Code available for payment source | Admitting: Internal Medicine

## 2019-12-02 ENCOUNTER — Encounter: Payer: Self-pay | Admitting: Internal Medicine

## 2019-12-02 VITALS — BP 112/68 | HR 102 | Temp 98.1°F | Ht 62.2 in | Wt 232.2 lb

## 2019-12-02 DIAGNOSIS — Z6841 Body Mass Index (BMI) 40.0 and over, adult: Secondary | ICD-10-CM | POA: Diagnosis not present

## 2019-12-02 DIAGNOSIS — M545 Low back pain, unspecified: Secondary | ICD-10-CM

## 2019-12-02 DIAGNOSIS — Z0000 Encounter for general adult medical examination without abnormal findings: Secondary | ICD-10-CM

## 2019-12-02 DIAGNOSIS — G8929 Other chronic pain: Secondary | ICD-10-CM

## 2019-12-02 DIAGNOSIS — N62 Hypertrophy of breast: Secondary | ICD-10-CM | POA: Diagnosis not present

## 2019-12-02 NOTE — Progress Notes (Signed)
I,Katawbba Wiggins,acting as a Education administrator for Maximino Greenland, MD.,have documented all relevant documentation on the behalf of Maximino Greenland, MD,as directed by  Maximino Greenland, MD while in the presence of Maximino Greenland, MD.  This visit occurred during the SARS-CoV-2 public health emergency.  Safety protocols were in place, including screening questions prior to the visit, additional usage of staff PPE, and extensive cleaning of exam room while observing appropriate contact time as indicated for disinfecting solutions.  Subjective:     Patient ID: Linda Richards , female    DOB: 09-27-1985 , 34 y.o.   MRN: [id number]   Chief Complaint  Patient presents with   Annual Exam    HPI  The patient is here today for a physical examination. She is followed by Dr. Charlesetta Garibaldi for her pap smears. Her last visit was in 2020. She has been evaluated by Dr. Nathanial Rancher for b/l breast reduction, as referred by Dr. Charlesetta Garibaldi. She reports that she has been having low back pain.  She currently wears 38J.     History reviewed. No pertinent past medical history.   Family History  Problem Relation Age of Onset   Diabetes Mother    Diabetes Father    Hypertension Father     No current outpatient medications on file.   Allergies  Allergen Reactions   Penicillins Swelling      The patient states she uses none for birth control. Last LMP was Patient's last menstrual period was 11/16/2019.. Negative for Dysmenorrhea. Negative for: breast discharge, breast lump(s), breast pain and breast self exam. Associated symptoms include abnormal vaginal bleeding. Pertinent negatives include abnormal bleeding (hematology), anxiety, decreased libido, depression, difficulty falling sleep, dyspareunia, history of infertility, nocturia, sexual dysfunction, sleep disturbances, urinary incontinence, urinary urgency, vaginal discharge and vaginal itching. Diet regular.The patient states her exercise level is  intermittent.    . The patient's tobacco use is:  Social History   Tobacco Use  Smoking Status Never Smoker  Smokeless Tobacco Never Used  . She has been exposed to passive smoke. The patient's alcohol use is:  Social History   Substance and Sexual Activity  Alcohol Use Yes   Comment: social   Review of Systems  Constitutional: Negative.   HENT: Negative.   Eyes: Negative.   Respiratory: Negative.   Cardiovascular: Negative.   Gastrointestinal: Negative.   Endocrine: Negative.   Genitourinary: Negative.   Musculoskeletal: Positive for back pain.  Skin: Negative.   Allergic/Immunologic: Negative.   Neurological: Negative.   Hematological: Negative.   Psychiatric/Behavioral: Negative.      Today's Vitals   12/02/19 1430  BP: 112/68  Pulse: (!) 102  Temp: 98.1 F (36.7 C)  TempSrc: Oral  Weight: 232 lb 3.2 oz (105.3 kg)  Height: 5' 2.2" (1.58 m)   Body mass index is 42.2 kg/m.  Wt Readings from Last 3 Encounters:  12/02/19 232 lb 3.2 oz (105.3 kg)  11/26/18 212 lb 6.4 oz (96.3 kg)  04/07/15 184 lb (83.5 kg)   Objective:  Physical Exam Constitutional:      General: She is not in acute distress.    Appearance: Normal appearance. She is well-developed. She is obese.  HENT:     Head: Normocephalic and atraumatic.     Right Ear: Hearing, tympanic membrane, ear canal and external ear normal. There is no impacted cerumen.     Left Ear: Hearing, tympanic membrane, ear canal and external ear normal. There is no impacted cerumen.  Nose:     Comments: Deferred, masked    Mouth/Throat:     Comments: Deferred, masked Eyes:     General: Lids are normal.     Extraocular Movements: Extraocular movements intact.     Conjunctiva/sclera: Conjunctivae normal.     Pupils: Pupils are equal, round, and reactive to light.     Funduscopic exam:    Right eye: No papilledema.        Left eye: No papilledema.  Neck:     Thyroid: No thyroid mass.     Vascular: No carotid bruit.   Cardiovascular:     Rate and Rhythm: Normal rate and regular rhythm.     Pulses: Normal pulses.     Heart sounds: Normal heart sounds. No murmur heard.   Pulmonary:     Effort: Pulmonary effort is normal.     Breath sounds: Normal breath sounds.  Abdominal:     General: Bowel sounds are normal. There is no distension.     Palpations: Abdomen is soft.     Tenderness: There is no abdominal tenderness.     Comments: Rounded, soft  Musculoskeletal:        General: No swelling. Normal range of motion.     Cervical back: Full passive range of motion without pain, normal range of motion and neck supple.     Right lower leg: No edema.     Left lower leg: No edema.  Skin:    General: Skin is warm and dry.     Capillary Refill: Capillary refill takes less than 2 seconds.     Comments: Indentations in shoulders with some hyperpigmentation.   Neurological:     General: No focal deficit present.     Mental Status: She is alert and oriented to person, place, and time.     Cranial Nerves: No cranial nerve deficit.     Sensory: No sensory deficit.  Psychiatric:        Mood and Affect: Mood normal.        Behavior: Behavior normal.        Thought Content: Thought content normal.        Judgment: Judgment normal.         Assessment And Plan:     1. Routine general medical examination at health care facility Comments: A full exam was performed. Importance of monthly self breast exams was discussed with the patient. Unfortunately, she left prior to getting Tdap. She was contacted and agrees to return to office later this week. PATIENT IS ADVISED TO GET 30-45 MINUTES REGULAR EXERCISE NO LESS THAN FOUR TO FIVE DAYS PER WEEK - BOTH WEIGHTBEARING EXERCISES AND AEROBIC ARE RECOMMENDED.  PATIENT IS ADVISED TO FOLLOW A HEALTHY DIET WITH AT LEAST SIX FRUITS/VEGGIES PER DAY, DECREASE INTAKE OF RED MEAT, AND TO INCREASE FISH INTAKE TO TWO DAYS PER WEEK.  MEATS/FISH SHOULD NOT BE FRIED, BAKED OR BROILED IS  PREFERABLE.  I SUGGEST WEARING SPF 50 SUNSCREEN ON EXPOSED PARTS AND ESPECIALLY WHEN IN THE DIRECT SUNLIGHT FOR AN EXTENDED PERIOD OF TIME.  PLEASE AVOID FAST FOOD RESTAURANTS AND INCREASE YOUR WATER INTAKE.  - Hemoglobin A1c - Hepatitis C antibody - VITAMIN D 25 Hydroxy (Vit-D Deficiency, Fractures) - CBC - CMP14+EGFR - Lipid panel  2. Macromastia Comments: She will continue under the care of Dr. Nathanial Rancher. I do think she would benefit from b/l breast reduction.   3. Chronic midline low back pain without sciatica Comments: Likely exacerbated by macromastia. She  was given some stretching exercises to perform.   4. BMI 40.0-44.9, adult (HCC) Comments: BMI 42. She is encouraged to initially strive for BMI less than 35 to decrease cardiac risk. Advised to aim for at least 150 minutes of exercise per week.    Patient was given opportunity to ask questions. Patient verbalized understanding of the plan and was able to repeat key elements of the plan. All questions were answered to their satisfaction.   Maximino Greenland, MD   I, Maximino Greenland, MD, have reviewed all documentation for this visit. The documentation on 12/21/19 for the exam, diagnosis, procedures, and orders are all accurate and complete.  THE PATIENT IS ENCOURAGED TO PRACTICE SOCIAL DISTANCING DUE TO THE COVID-19 PANDEMIC.

## 2019-12-02 NOTE — Patient Instructions (Addendum)
Health Maintenance, Female Adopting a healthy lifestyle and getting preventive care are important in promoting health and wellness. Ask your health care provider about:  The right schedule for you to have regular tests and exams.  Things you can do on your own to prevent diseases and keep yourself healthy. What should I know about diet, weight, and exercise? Eat a healthy diet   Eat a diet that includes plenty of vegetables, fruits, low-fat dairy products, and lean protein.  Do not eat a lot of foods that are high in solid fats, added sugars, or sodium. Maintain a healthy weight Body mass index (BMI) is used to identify weight problems. It estimates body fat based on height and weight. Your health care provider can help determine your BMI and help you achieve or maintain a healthy weight. Get regular exercise Get regular exercise. This is one of the most important things you can do for your health. Most adults should:  Exercise for at least 150 minutes each week. The exercise should increase your heart rate and make you sweat (moderate-intensity exercise).  Do strengthening exercises at least twice a week. This is in addition to the moderate-intensity exercise.  Spend less time sitting. Even light physical activity can be beneficial. Watch cholesterol and blood lipids Have your blood tested for lipids and cholesterol at 33 years of age, then have this test every 5 years. Have your cholesterol levels checked more often if:  Your lipid or cholesterol levels are high.  You are older than 33 years of age.  You are at high risk for heart disease. What should I know about cancer screening? Depending on your health history and family history, you may need to have cancer screening at various ages. This may include screening for:  Breast cancer.  Cervical cancer.  Colorectal cancer.  Skin cancer.  Lung cancer. What should I know about heart disease, diabetes, and high blood  pressure? Blood pressure and heart disease  High blood pressure causes heart disease and increases the risk of stroke. This is more likely to develop in people who have high blood pressure readings, are of African descent, or are overweight.  Have your blood pressure checked: ? Every 3-5 years if you are 18-39 years of age. ? Every year if you are 40 years old or older. Diabetes Have regular diabetes screenings. This checks your fasting blood sugar level. Have the screening done:  Once every three years after age 40 if you are at a normal weight and have a low risk for diabetes.  More often and at a younger age if you are overweight or have a high risk for diabetes. What should I know about preventing infection? Hepatitis B If you have a higher risk for hepatitis B, you should be screened for this virus. Talk with your health care provider to find out if you are at risk for hepatitis B infection. Hepatitis C Testing is recommended for:  Everyone born from 1945 through 1965.  Anyone with known risk factors for hepatitis C. Sexually transmitted infections (STIs)  Get screened for STIs, including gonorrhea and chlamydia, if: ? You are sexually active and are younger than 33 years of age. ? You are older than 33 years of age and your health care provider tells you that you are at risk for this type of infection. ? Your sexual activity has changed since you were last screened, and you are at increased risk for chlamydia or gonorrhea. Ask your health care provider if   you are at risk.  Ask your health care provider about whether you are at high risk for HIV. Your health care provider may recommend a prescription medicine to help prevent HIV infection. If you choose to take medicine to prevent HIV, you should first get tested for HIV. You should then be tested every 3 months for as long as you are taking the medicine. Pregnancy  If you are about to stop having your period (premenopausal) and  you may become pregnant, seek counseling before you get pregnant.  Take 400 to 800 micrograms (mcg) of folic acid every day if you become pregnant.  Ask for birth control (contraception) if you want to prevent pregnancy. Osteoporosis and menopause Osteoporosis is a disease in which the bones lose minerals and strength with aging. This can result in bone fractures. If you are 54 years old or older, or if you are at risk for osteoporosis and fractures, ask your health care provider if you should:  Be screened for bone loss.  Take a calcium or vitamin D supplement to lower your risk of fractures.  Be given hormone replacement therapy (HRT) to treat symptoms of menopause. Follow these instructions at home: Lifestyle  Do not use any products that contain nicotine or tobacco, such as cigarettes, e-cigarettes, and chewing tobacco. If you need help quitting, ask your health care provider.  Do not use street drugs.  Do not share needles.  Ask your health care provider for help if you need support or information about quitting drugs. Alcohol use  Do not drink alcohol if: ? Your health care provider tells you not to drink. ? You are pregnant, may be pregnant, or are planning to become pregnant.  If you drink alcohol: ? Limit how much you use to 0-1 drink a day. ? Limit intake if you are breastfeeding.  Be aware of how much alcohol is in your drink. In the [place] one drink equals one 12 oz bottle of beer (355 mL), one 5 oz glass of wine (148 mL), or one 1 oz glass of hard liquor (44 mL). General instructions  Schedule regular health, dental, and eye exams.  Stay current with your vaccines.  Tell your health care provider if: ? You often feel depressed. ? You have ever been abused or do not feel safe at home. Summary  Adopting a healthy lifestyle and getting preventive care are important in promoting health and wellness.  Follow your health care provider's instructions about healthy  diet, exercising, and getting tested or screened for diseases.  Follow your health care provider's instructions on monitoring your cholesterol and blood pressure. This information is not intended to replace advice given to you by your health care provider. Make sure you discuss any questions you have with your health care provider. Document Revised: 03/21/2018 Document Reviewed: 03/21/2018 Elsevier Patient Education  2020 [employer]  Chronic Back Pain When back pain lasts longer than 3 months, it is called chronic back pain.The cause of your back pain may not be known. Some common causes include:  Wear and tear (degenerative disease) of the bones, ligaments, or disks in your back.  Inflammation and stiffness in your back (arthritis). People who have chronic back pain often go through certain periods in which the pain is more intense (flare-ups). Many people can learn to manage the pain with home care. Follow these instructions at home: Pay attention to any changes in your symptoms. Take these actions to help with your pain: Activity   Avoid  bending and other activities that make the problem worse.  Maintain a proper position when standing or sitting: ? When standing, keep your upper back and neck straight, with your shoulders pulled back. Avoid slouching. ? When sitting, keep your back straight and relax your shoulders. Do not round your shoulders or pull them backward.  Do not sit or stand in one place for long periods of time.  Take brief periods of rest throughout the day. This will reduce your pain. Resting in a lying or standing position is usually better than sitting to rest.  When you are resting for longer periods, mix in some mild activity or stretching between periods of rest. This will help to prevent stiffness and pain.  Get regular exercise. Ask your health care provider what activities are safe for you.  Do not lift anything that is heavier than 10 lb (4.5 kg).  Always use proper lifting technique, which includes: ? Bending your knees. ? Keeping the load close to your body. ? Avoiding twisting.  Sleep on a firm mattress in a comfortable position. Try lying on your side with your knees slightly bent. If you lie on your back, put a pillow under your knees. Managing pain  If directed, apply ice to the painful area. Your health care provider may recommend applying ice during the first 24-48 hours after a flare-up begins. ? Put ice in a plastic bag. ? Place a towel between your skin and the bag. ? Leave the ice on for 20 minutes, 2-3 times per day.  If directed, apply heat to the affected area as often as told by your health care provider. Use the heat source that your health care provider recommends, such as a moist heat pack or a heating pad. ? Place a towel between your skin and the heat source. ? Leave the heat on for 20-30 minutes. ? Remove the heat if your skin turns bright red. This is especially important if you are unable to feel pain, heat, or cold. You may have a greater risk of getting burned.  Try soaking in a warm tub.  Take over-the-counter and prescription medicines only as told by your health care provider.  Keep all follow-up visits as told by your health care provider. This is important. Contact a health care provider if:  You have pain that is not relieved with rest or medicine. Get help right away if:  You have weakness or numbness in one or both of your legs or feet.  You have trouble controlling your bladder or your bowels.  You have nausea or vomiting.  You have pain in your abdomen.  You have shortness of breath or you faint. This information is not intended to replace advice given to you by your health care provider. Make sure you discuss any questions you have with your health care provider. Document Revised: 07/19/2018 Document Reviewed: 10/05/2016 Elsevier Patient Education  2020 [employer].

## 2019-12-03 LAB — CMP14+EGFR
ALT: 20 IU/L (ref 0–32)
AST: 18 IU/L (ref 0–40)
Albumin/Globulin Ratio: 1.8 (ref 1.2–2.2)
Albumin: 4.4 g/dL (ref 3.8–4.8)
Alkaline Phosphatase: 52 IU/L (ref 48–121)
BUN/Creatinine Ratio: 8 — ABNORMAL LOW (ref 9–23)
BUN: 9 mg/dL (ref 6–20)
Bilirubin Total: 0.6 mg/dL (ref 0.0–1.2)
CO2: 23 mmol/L (ref 20–29)
Calcium: 9.2 mg/dL (ref 8.7–10.2)
Chloride: 102 mmol/L (ref 96–106)
Creatinine, Ser: 1.2 mg/dL — ABNORMAL HIGH (ref 0.57–1.00)
GFR calc Af Amer: 69 mL/min/{1.73_m2} (ref >59)
GFR calc non Af Amer: 60 mL/min/{1.73_m2} (ref >59)
Globulin, Total: 2.5 g/dL (ref 1.5–4.5)
Glucose: 93 mg/dL (ref 65–99)
Potassium: 4.6 mmol/L (ref 3.5–5.2)
Sodium: 137 mmol/L (ref 134–144)
Total Protein: 6.9 g/dL (ref 6.0–8.5)

## 2019-12-03 LAB — CBC
Hematocrit: 41.7 % (ref 34.0–46.6)
Hemoglobin: 14.3 g/dL (ref 11.1–15.9)
MCH: 32.2 pg (ref 26.6–33.0)
MCHC: 34.3 g/dL (ref 31.5–35.7)
MCV: 94 fL (ref 79–97)
Platelets: 265 10*3/uL (ref 150–450)
RBC: 4.44 x10E6/uL (ref 3.77–5.28)
RDW: 12.7 % (ref 11.7–15.4)
WBC: 7.4 10*3/uL (ref 3.4–10.8)

## 2019-12-03 LAB — VITAMIN D 25 HYDROXY (VIT D DEFICIENCY, FRACTURES): Vit D, 25-Hydroxy: 23.3 ng/mL — ABNORMAL LOW (ref 30.0–100.0)

## 2019-12-03 LAB — LIPID PANEL
Chol/HDL Ratio: 4.5 ratio — ABNORMAL HIGH (ref 0.0–4.4)
Cholesterol, Total: 186 mg/dL (ref 100–199)
HDL: 41 mg/dL (ref >39)
LDL Chol Calc (NIH): 112 mg/dL — ABNORMAL HIGH (ref 0–99)
Triglycerides: 186 mg/dL — ABNORMAL HIGH (ref 0–149)
VLDL Cholesterol Cal: 33 mg/dL (ref 5–40)

## 2019-12-03 LAB — HEMOGLOBIN A1C
Est. average glucose Bld gHb Est-mCnc: 128 mg/dL
Hgb A1c MFr Bld: 6.1 % — ABNORMAL HIGH (ref 4.8–5.6)

## 2019-12-03 LAB — HEPATITIS C ANTIBODY: Hep C Virus Ab: <0.1 s/co ratio (ref 0.0–0.9)

## 2019-12-04 DIAGNOSIS — Z23 Encounter for immunization: Secondary | ICD-10-CM | POA: Diagnosis not present

## 2019-12-10 ENCOUNTER — Encounter: Payer: Self-pay | Admitting: Internal Medicine

## 2020-01-30 ENCOUNTER — Ambulatory Visit: Payer: Self-pay | Admitting: Plastic Surgery

## 2020-02-03 ENCOUNTER — Encounter (HOSPITAL_BASED_OUTPATIENT_CLINIC_OR_DEPARTMENT_OTHER): Payer: Self-pay | Admitting: Plastic Surgery

## 2020-02-03 ENCOUNTER — Other Ambulatory Visit: Payer: Self-pay

## 2020-02-07 ENCOUNTER — Inpatient Hospital Stay (HOSPITAL_COMMUNITY): Admission: RE | Admit: 2020-02-07 | Payer: No Typology Code available for payment source | Source: Ambulatory Visit

## 2020-02-08 ENCOUNTER — Other Ambulatory Visit (HOSPITAL_COMMUNITY)
Admission: RE | Admit: 2020-02-08 | Discharge: 2020-02-08 | Disposition: A | Discharge status: Home / Self Care | Payer: No Typology Code available for payment source | Source: Ambulatory Visit | Attending: Plastic Surgery | Admitting: Plastic Surgery

## 2020-02-08 DIAGNOSIS — Z20822 Contact with and (suspected) exposure to covid-19: Secondary | ICD-10-CM | POA: Insufficient documentation

## 2020-02-08 DIAGNOSIS — Z01812 Encounter for preprocedural laboratory examination: Secondary | ICD-10-CM | POA: Diagnosis present

## 2020-02-09 LAB — SARS CORONAVIRUS 2 (TAT 6-24 HRS): SARS Coronavirus 2: NEGATIVE

## 2020-02-10 ENCOUNTER — Ambulatory Visit: Payer: Self-pay | Admitting: Plastic Surgery

## 2020-02-11 ENCOUNTER — Other Ambulatory Visit: Payer: Self-pay

## 2020-02-11 ENCOUNTER — Encounter (HOSPITAL_BASED_OUTPATIENT_CLINIC_OR_DEPARTMENT_OTHER)
Admission: RE | Disposition: A | Discharge status: Home / Self Care | Payer: Self-pay | Source: Home / Self Care | Attending: Plastic Surgery

## 2020-02-11 ENCOUNTER — Ambulatory Visit (HOSPITAL_BASED_OUTPATIENT_CLINIC_OR_DEPARTMENT_OTHER): Payer: No Typology Code available for payment source | Admitting: Certified Registered"

## 2020-02-11 ENCOUNTER — Ambulatory Visit (HOSPITAL_BASED_OUTPATIENT_CLINIC_OR_DEPARTMENT_OTHER)
Admission: RE | Admit: 2020-02-11 | Discharge: 2020-02-11 | Disposition: A | Discharge status: Home / Self Care | Payer: No Typology Code available for payment source | Attending: Plastic Surgery | Admitting: Plastic Surgery

## 2020-02-11 ENCOUNTER — Encounter (HOSPITAL_BASED_OUTPATIENT_CLINIC_OR_DEPARTMENT_OTHER): Payer: Self-pay | Admitting: Plastic Surgery

## 2020-02-11 DIAGNOSIS — N62 Hypertrophy of breast: Secondary | ICD-10-CM | POA: Diagnosis present

## 2020-02-11 DIAGNOSIS — Z6841 Body Mass Index (BMI) 40.0 and over, adult: Secondary | ICD-10-CM | POA: Diagnosis not present

## 2020-02-11 DIAGNOSIS — Z87891 Personal history of nicotine dependence: Secondary | ICD-10-CM | POA: Insufficient documentation

## 2020-02-11 DIAGNOSIS — Z803 Family history of malignant neoplasm of breast: Secondary | ICD-10-CM | POA: Diagnosis not present

## 2020-02-11 HISTORY — DX: Family history of other specified conditions: Z84.89

## 2020-02-11 HISTORY — PX: BREAST REDUCTION SURGERY: SHX8

## 2020-02-11 LAB — POCT PREGNANCY, URINE: Preg Test, Ur: NEGATIVE

## 2020-02-11 SURGERY — MAMMOPLASTY, REDUCTION
Anesthesia: General | Site: Breast | Laterality: Bilateral

## 2020-02-11 MED ORDER — MIDAZOLAM HCL 5 MG/5ML IJ SOLN
INTRAMUSCULAR | Status: DC | PRN
  Administered 2020-02-11: 2 mg via INTRAVENOUS

## 2020-02-11 MED ORDER — SUGAMMADEX SODIUM 500 MG/5ML IV SOLN
INTRAVENOUS | Status: AC
  Filled 2020-02-11: qty 5

## 2020-02-11 MED ORDER — ROCURONIUM BROMIDE 10 MG/ML (PF) SYRINGE
PREFILLED_SYRINGE | INTRAVENOUS | Status: AC
  Filled 2020-02-11: qty 20

## 2020-02-11 MED ORDER — LIDOCAINE HCL (CARDIAC) PF 100 MG/5ML IV SOSY
PREFILLED_SYRINGE | INTRAVENOUS | Status: DC | PRN
  Administered 2020-02-11: 100 mg via INTRAVENOUS

## 2020-02-11 MED ORDER — FENTANYL CITRATE (PF) 100 MCG/2ML IJ SOLN
25.0000 ug | INTRAMUSCULAR | Status: DC | PRN
  Administered 2020-02-11 (×2): 50 ug via INTRAVENOUS

## 2020-02-11 MED ORDER — PROPOFOL 10 MG/ML IV BOLUS
INTRAVENOUS | Status: AC
  Filled 2020-02-11: qty 20

## 2020-02-11 MED ORDER — SODIUM CHLORIDE 0.9 % IV SOLN
INTRAVENOUS | Status: DC | PRN
  Administered 2020-02-11 (×2): 60 mL

## 2020-02-11 MED ORDER — LACTATED RINGERS IV SOLN: INTRAVENOUS | Status: DC

## 2020-02-11 MED ORDER — FENTANYL CITRATE (PF) 100 MCG/2ML IJ SOLN
INTRAMUSCULAR | Status: AC
  Filled 2020-02-11: qty 2

## 2020-02-11 MED ORDER — TRANEXAMIC ACID-NACL 1000-0.7 MG/100ML-% IV SOLN
INTRAVENOUS | Status: DC | PRN
  Administered 2020-02-11: 1000 mg via INTRAVENOUS

## 2020-02-11 MED ORDER — ROCURONIUM BROMIDE 10 MG/ML (PF) SYRINGE
PREFILLED_SYRINGE | INTRAVENOUS | Status: AC
  Filled 2020-02-11: qty 10

## 2020-02-11 MED ORDER — BUPIVACAINE HCL (PF) 0.25 % IJ SOLN
INTRAMUSCULAR | Status: AC
  Filled 2020-02-11: qty 30

## 2020-02-11 MED ORDER — DEXAMETHASONE SODIUM PHOSPHATE 4 MG/ML IJ SOLN
INTRAMUSCULAR | Status: DC | PRN
  Administered 2020-02-11: 5 mg via INTRAVENOUS

## 2020-02-11 MED ORDER — SODIUM CHLORIDE (PF) 0.9 % IJ SOLN
INTRAMUSCULAR | Status: DC | PRN
  Administered 2020-02-11: 60 mL

## 2020-02-11 MED ORDER — FENTANYL CITRATE (PF) 100 MCG/2ML IJ SOLN
INTRAMUSCULAR | Status: DC | PRN
  Administered 2020-02-11: 50 ug via INTRAVENOUS
  Administered 2020-02-11: 25 ug via INTRAVENOUS
  Administered 2020-02-11: 100 ug via INTRAVENOUS
  Administered 2020-02-11 (×2): 50 ug via INTRAVENOUS
  Administered 2020-02-11: 25 ug via INTRAVENOUS
  Administered 2020-02-11 (×2): 50 ug via INTRAVENOUS

## 2020-02-11 MED ORDER — BUPIVACAINE-EPINEPHRINE (PF) 0.5% -1:200000 IJ SOLN
INTRAMUSCULAR | Status: AC
  Filled 2020-02-11: qty 30

## 2020-02-11 MED ORDER — SUGAMMADEX SODIUM 500 MG/5ML IV SOLN
INTRAVENOUS | Status: DC | PRN
  Administered 2020-02-11: 400 mg via INTRAVENOUS

## 2020-02-11 MED ORDER — CHLORHEXIDINE GLUCONATE CLOTH 2 % EX PADS: 6.0000 | MEDICATED_PAD | Freq: Once | CUTANEOUS | Status: DC

## 2020-02-11 MED ORDER — OXYCODONE HCL 5 MG PO TABS: 5.0000 mg | ORAL_TABLET | Freq: Once | ORAL | Status: DC | PRN

## 2020-02-11 MED ORDER — BUPIVACAINE LIPOSOME 1.3 % IJ SUSP
INTRAMUSCULAR | Status: AC
  Filled 2020-02-11: qty 20

## 2020-02-11 MED ORDER — BACITRACIN ZINC 500 UNIT/GM EX OINT
TOPICAL_OINTMENT | CUTANEOUS | Status: AC
  Filled 2020-02-11: qty 28.35

## 2020-02-11 MED ORDER — ROCURONIUM BROMIDE 100 MG/10ML IV SOLN
INTRAVENOUS | Status: DC | PRN
  Administered 2020-02-11 (×2): 20 mg via INTRAVENOUS
  Administered 2020-02-11: 30 mg via INTRAVENOUS
  Administered 2020-02-11: 70 mg via INTRAVENOUS
  Administered 2020-02-11: 30 mg via INTRAVENOUS
  Administered 2020-02-11: 10 mg via INTRAVENOUS

## 2020-02-11 MED ORDER — PROPOFOL 10 MG/ML IV BOLUS
INTRAVENOUS | Status: DC | PRN
  Administered 2020-02-11: 200 mg via INTRAVENOUS

## 2020-02-11 MED ORDER — ONDANSETRON HCL 4 MG/2ML IJ SOLN: 4.0000 mg | Freq: Once | INTRAMUSCULAR | Status: DC | PRN

## 2020-02-11 MED ORDER — LIDOCAINE 2% (20 MG/ML) 5 ML SYRINGE
INTRAMUSCULAR | Status: AC
  Filled 2020-02-11: qty 5

## 2020-02-11 MED ORDER — OXYCODONE HCL 5 MG/5ML PO SOLN: 5.0000 mg | Freq: Once | ORAL | Status: DC | PRN

## 2020-02-11 MED ORDER — EPINEPHRINE PF 1 MG/ML IJ SOLN
INTRAMUSCULAR | Status: AC
  Filled 2020-02-11: qty 1

## 2020-02-11 MED ORDER — LIDOCAINE-EPINEPHRINE 1 %-1:100000 IJ SOLN
INTRAMUSCULAR | Status: AC
  Filled 2020-02-11: qty 1

## 2020-02-11 MED ORDER — CEFAZOLIN SODIUM-DEXTROSE 2-4 GM/100ML-% IV SOLN
2.0000 g | INTRAVENOUS | Status: AC
  Administered 2020-02-11: 2 g via INTRAVENOUS

## 2020-02-11 MED ORDER — SODIUM CHLORIDE (PF) 0.9 % IJ SOLN
INTRAMUSCULAR | Status: AC
  Filled 2020-02-11: qty 20

## 2020-02-11 MED ORDER — ONDANSETRON HCL 4 MG/2ML IJ SOLN
INTRAMUSCULAR | Status: DC | PRN
  Administered 2020-02-11 (×2): 4 mg via INTRAVENOUS

## 2020-02-11 MED ORDER — AMISULPRIDE (ANTIEMETIC) 5 MG/2ML IV SOLN: 10.0000 mg | Freq: Once | INTRAVENOUS | Status: DC | PRN

## 2020-02-11 MED ORDER — DEXAMETHASONE SODIUM PHOSPHATE 10 MG/ML IJ SOLN
INTRAMUSCULAR | Status: AC
  Filled 2020-02-11: qty 1

## 2020-02-11 MED ORDER — PROPOFOL 500 MG/50ML IV EMUL
INTRAVENOUS | Status: DC | PRN
  Administered 2020-02-11: 25 ug/kg/min via INTRAVENOUS

## 2020-02-11 MED ORDER — PHENYLEPHRINE HCL (PRESSORS) 10 MG/ML IV SOLN
INTRAVENOUS | Status: DC | PRN
  Administered 2020-02-11: 80 ug via INTRAVENOUS
  Administered 2020-02-11: 120 ug via INTRAVENOUS
  Administered 2020-02-11 (×2): 80 ug via INTRAVENOUS
  Administered 2020-02-11: 120 ug via INTRAVENOUS

## 2020-02-11 MED ORDER — LABETALOL HCL 5 MG/ML IV SOLN
INTRAVENOUS | Status: DC | PRN
  Administered 2020-02-11: 5 mg via INTRAVENOUS

## 2020-02-11 MED ORDER — PROPOFOL 10 MG/ML IV BOLUS
INTRAVENOUS | Status: AC
  Filled 2020-02-11: qty 40

## 2020-02-11 MED ORDER — ONDANSETRON HCL 4 MG/2ML IJ SOLN
INTRAMUSCULAR | Status: AC
  Filled 2020-02-11: qty 2

## 2020-02-11 MED ORDER — MIDAZOLAM HCL 2 MG/2ML IJ SOLN
INTRAMUSCULAR | Status: AC
  Filled 2020-02-11: qty 2

## 2020-02-11 MED ORDER — BACITRACIN ZINC 500 UNIT/GM EX OINT
TOPICAL_OINTMENT | CUTANEOUS | Status: AC
  Filled 2020-02-11: qty 0.9

## 2020-02-11 SURGICAL SUPPLY — 63 items
BAG DECANTER FOR FLEXI CONT (MISCELLANEOUS) ×3 IMPLANT
BLADE HEX COATED 2.75 (ELECTRODE) ×2 IMPLANT
BLADE KNIFE PERSONA 10 (BLADE) ×12 IMPLANT
BLADE KNIFE PERSONA 15 (BLADE) ×7 IMPLANT
BNDG GAUZE ELAST 4 BULKY (GAUZE/BANDAGES/DRESSINGS) ×6 IMPLANT
CANISTER SUCT 1200ML W/VALVE (MISCELLANEOUS) ×3 IMPLANT
CAP BOUFFANT 24 BLUE NURSES (PROTECTIVE WEAR) ×3 IMPLANT
CLOSURE WOUND 1/2 X4 (GAUZE/BANDAGES/DRESSINGS) ×6
COVER BACK TABLE 60X90IN (DRAPES) ×3 IMPLANT
COVER MAYO STAND STRL (DRAPES) ×3 IMPLANT
COVER WAND RF STERILE (DRAPES) IMPLANT
DECANTER SPIKE VIAL GLASS SM (MISCELLANEOUS) ×6 IMPLANT
DRAIN CHANNEL 10F 3/8 F FF (DRAIN) ×6 IMPLANT
DRAPE U-SHAPE 76X120 STRL (DRAPES) ×6 IMPLANT
DRSG EMULSION OIL 3X3 NADH (GAUZE/BANDAGES/DRESSINGS) ×6 IMPLANT
DRSG PAD ABDOMINAL 8X10 ST (GAUZE/BANDAGES/DRESSINGS) ×6 IMPLANT
ELECT BLADE 4.0 EZ CLEAN MEGAD (MISCELLANEOUS) ×3
ELECT REM PT RETURN 9FT ADLT (ELECTROSURGICAL) ×3
ELECTRODE BLDE 4.0 EZ CLN MEGD (MISCELLANEOUS) ×1 IMPLANT
ELECTRODE REM PT RTRN 9FT ADLT (ELECTROSURGICAL) ×1 IMPLANT
EVACUATOR SILICONE 100CC (DRAIN) ×6 IMPLANT
GAUZE SPONGE 4X4 12PLY STRL (GAUZE/BANDAGES/DRESSINGS) ×6 IMPLANT
GLOVE BIO SURGEON STRL SZ7 (GLOVE) ×3 IMPLANT
GLOVE BIO SURGEON STRL SZ7.5 (GLOVE) ×6 IMPLANT
GOWN STRL REUS W/ TWL LRG LVL3 (GOWN DISPOSABLE) ×2 IMPLANT
GOWN STRL REUS W/TWL LRG LVL3 (GOWN DISPOSABLE) ×9
MARKER SKIN DUAL TIP RULER LAB (MISCELLANEOUS) ×2 IMPLANT
NDL HYPO 25X1 1.5 SAFETY (NEEDLE) ×3 IMPLANT
NDL SAFETY ECLIPSE 18X1.5 (NEEDLE) ×1 IMPLANT
NDL SPNL 18GX3.5 QUINCKE PK (NEEDLE) ×1 IMPLANT
NEEDLE HYPO 18GX1.5 SHARP (NEEDLE)
NEEDLE HYPO 25X1 1.5 SAFETY (NEEDLE) ×9 IMPLANT
NEEDLE SPNL 18GX3.5 QUINCKE PK (NEEDLE) ×3 IMPLANT
NS IRRIG 1000ML POUR BTL (IV SOLUTION) ×6 IMPLANT
PACK BASIN DAY SURGERY FS (CUSTOM PROCEDURE TRAY) ×3 IMPLANT
PAD FOAM SILICONE BACKED (GAUZE/BANDAGES/DRESSINGS) ×3 IMPLANT
PENCIL SMOKE EVACUATOR (MISCELLANEOUS) ×3 IMPLANT
PIN SAFETY STERILE (MISCELLANEOUS) ×3 IMPLANT
SCRUB TECHNI CARE 4 OZ NO DYE (MISCELLANEOUS) ×3 IMPLANT
SLEEVE SCD COMPRESS KNEE MED (MISCELLANEOUS) ×3 IMPLANT
SPONGE LAP 18X18 RF (DISPOSABLE) ×19 IMPLANT
STAPLER VISISTAT 35W (STAPLE) ×5 IMPLANT
STRIP CLOSURE SKIN 1/2X4 (GAUZE/BANDAGES/DRESSINGS) ×10 IMPLANT
SUT ETHILON 3 0 PS 1 (SUTURE) ×3 IMPLANT
SUT MNCRL AB 3-0 PS2 18 (SUTURE) ×14 IMPLANT
SUT MNCRL AB 4-0 PS2 18 (SUTURE) ×8 IMPLANT
SUT MON AB 5-0 PS2 18 (SUTURE) ×6 IMPLANT
SUT PROLENE 2 0 CT2 30 (SUTURE) ×5 IMPLANT
SUT PROLENE 3 0 PS 1 (SUTURE) ×6 IMPLANT
SUT VLOC 90 P-14 23 (SUTURE) ×4 IMPLANT
SYR 20ML LL LF (SYRINGE) ×3 IMPLANT
SYR BULB IRRIG 60ML STRL (SYRINGE) ×6 IMPLANT
SYR CONTROL 10ML LL (SYRINGE) ×6 IMPLANT
TAPE MEASURE VINYL STERILE (MISCELLANEOUS) ×3 IMPLANT
TOWEL GREEN STERILE FF (TOWEL DISPOSABLE) ×9 IMPLANT
TRAY DSU PREP LF (CUSTOM PROCEDURE TRAY) ×3 IMPLANT
TRAY FOL W/BAG SLVR 16FR STRL (SET/KITS/TRAYS/PACK) IMPLANT
TRAY FOLEY W/BAG SLVR 14FR LF (SET/KITS/TRAYS/PACK) ×2 IMPLANT
TRAY FOLEY W/BAG SLVR 16FR LF (SET/KITS/TRAYS/PACK)
TUBE CONNECTING 20'X1/4 (TUBING) ×1
TUBE CONNECTING 20X1/4 (TUBING) ×2 IMPLANT
UNDERPAD 30X36 HEAVY ABSORB (UNDERPADS AND DIAPERS) ×6 IMPLANT
YANKAUER SUCT BULB TIP NO VENT (SUCTIONS) ×5 IMPLANT

## 2020-02-11 NOTE — Anesthesia Postprocedure Evaluation (Signed)
Anesthesia Post Note  Patient: Linda Richards  Procedure(s) Performed: BILATERAL MAMMARY REDUCTION  (BREAST) (Bilateral Breast)     Patient location during evaluation: PACU Anesthesia Type: General Level of consciousness: awake and alert Pain management: pain level controlled Vital Signs Assessment: post-procedure vital signs reviewed and stable Respiratory status: spontaneous breathing, nonlabored ventilation and respiratory function stable Cardiovascular status: blood pressure returned to baseline and stable Postop Assessment: no apparent nausea or vomiting Anesthetic complications: no   No complications documented.  Last Vitals:  Vitals:   02/11/20 1356 02/11/20 1406  BP:    Pulse: (!) 102 (!) 107  Resp: 18 13  Temp:    SpO2: 95% 96%    Last Pain:  Vitals:   02/11/20 1400  TempSrc:   PainSc: 6                  Lowella Curb

## 2020-02-11 NOTE — Discharge Instructions (Signed)
1. No lifting greater than 5 lbs with arms for 4 weeks. 2. Empty, strip, record and reactivate JP drains 3 times a day. 3. Percocet 5/325 mg tabs 1-2 tabs po q 4-6 hours prn pain- prescription given in office. 4. Duricef 1 tab po bid- prescription given in office. 5. Sterapred dose pack as directed- prescription given in office. 6. Follow-up appointment Thursday in office.      Post Anesthesia Home Care Instructions  Activity: Get plenty of rest for the remainder of the day. A responsible individual must stay with you for 24 hours following the procedure.  For the next 24 hours, DO NOT: -Drive a car -Operate machinery -Drink alcoholic beverages -Take any medication unless instructed by your physician -Make any legal decisions or sign important papers.  Meals: Start with liquid foods such as gelatin or soup. Progress to regular foods as tolerated. Avoid greasy, spicy, heavy foods. If nausea and/or vomiting occur, drink only clear liquids until the nausea and/or vomiting subsides. Call your physician if vomiting continues.  Special Instructions/Symptoms: Your throat may feel dry or sore from the anesthesia or the breathing tube placed in your throat during surgery. If this causes discomfort, gargle with warm salt water. The discomfort should disappear within 24 hours.  If you had a scopolamine patch placed behind your ear for the management of post- operative nausea and/or vomiting:  1. The medication in the patch is effective for 72 hours, after which it should be removed.  Wrap patch in a tissue and discard in the trash. Wash hands thoroughly with soap and water. 2. You may remove the patch earlier than 72 hours if you experience unpleasant side effects which may include dry mouth, dizziness or visual disturbances. 3. Avoid touching the patch. Wash your hands with soap and water after contact with the patch.      Information for Discharge Teaching: EXPAREL (bupivacaine liposome  injectable suspension)   Your surgeon or anesthesiologist gave you EXPAREL(bupivacaine) to help control your pain after surgery.   EXPAREL is a local anesthetic that provides pain relief by numbing the tissue around the surgical site.  EXPAREL is designed to release pain medication over time and can control pain for up to 72 hours.  Depending on how you respond to EXPAREL, you may require less pain medication during your recovery.  Possible side effects:  Temporary loss of sensation or ability to move in the area where bupivacaine was injected.  Nausea, vomiting, constipation  Rarely, numbness and tingling in your mouth or lips, lightheadedness, or anxiety may occur.  Call your doctor right away if you think you may be experiencing any of these sensations, or if you have other questions regarding possible side effects.  Follow all other discharge instructions given to you by your surgeon or nurse. Eat a healthy diet and drink plenty of water or other fluids.  If you return to the hospital for any reason within 96 hours following the administration of EXPAREL, it is important for health care providers to know that you have received this anesthetic. A teal colored band has been placed on your arm with the date, time and amount of EXPAREL you have received in order to alert and inform your health care providers. Please leave this armband in place for the full 96 hours following administration, and then you may remove the band.       JP Drain Totals  Bring this sheet to all of your post-operative appointments while you have your drains.    Please measure your drains by CC's or ML's.  Make sure you drain and measure your JP Drains 2 or 3 times per day.  At the end of each day, add up totals for the left side and add up totals for the right side.    ( 9 am )     ( 3 pm )        ( 9 pm )                Date L  R  L  R  L  R  Total L/R                                                                                                                                                                                         

## 2020-02-11 NOTE — Anesthesia Preprocedure Evaluation (Addendum)
Anesthesia Evaluation  Patient identified by MRN, date of birth, ID band Patient awake    Reviewed: Allergy & Precautions, NPO status , Patient's Chart, lab work & pertinent test results  History of Anesthesia Complications Negative for: history of anesthetic complications  Airway Mallampati: II  TM Distance: >3 FB Neck ROM: Full    Dental  (+) Teeth Intact   Pulmonary neg pulmonary ROS,    Pulmonary exam normal        Cardiovascular negative cardio ROS Normal cardiovascular exam     Neuro/Psych negative neurological ROS  negative psych ROS   GI/Hepatic negative GI ROS, Neg liver ROS,   Endo/Other  Morbid obesity  Renal/GU negative Renal ROS  negative genitourinary   Musculoskeletal negative musculoskeletal ROS (+)   Abdominal   Peds  Hematology negative hematology ROS (+)   Anesthesia Other Findings   Reproductive/Obstetrics negative OB ROS                           Anesthesia Physical Anesthesia Plan  ASA: III  Anesthesia Plan: General   Post-op Pain Management:    Induction: Intravenous  PONV Risk Score and Plan: 3 and Ondansetron, Dexamethasone, Treatment may vary due to age or medical condition and Midazolam  Airway Management Planned: Oral ETT  Additional Equipment: None  Intra-op Plan:   Post-operative Plan: Extubation in OR  Informed Consent: I have reviewed the patients History and Physical, chart, labs and discussed the procedure including the risks, benefits and alternatives for the proposed anesthesia with the patient or authorized representative who has indicated his/her understanding and acceptance.     Dental advisory given  Plan Discussed with:   Anesthesia Plan Comments:        Anesthesia Quick Evaluation

## 2020-02-11 NOTE — Brief Op Note (Signed)
02/11/2020  1:13 PM  PATIENT:  Linda Richards  34 y.o. female  PRE-OPERATIVE DIAGNOSIS:  BILATERAL MACROMASTIA  POST-OPERATIVE DIAGNOSIS:  BILATERAL MACROMASTIA  PROCEDURE:  Procedure(s): BILATERAL MAMMARY REDUCTION  (BREAST) (Bilateral)  SURGEON:  Surgeon(s) and Role:    * Tyeesha Riker, Chales Abrahams, MD - Primary  ASSISTANTS: Adrienne Mocha, RNFA  ANESTHESIA:   general  EBL:  200 mL   BLOOD ADMINISTERED:none  DRAINS: (54f) Jackson-Pratt drain(s) with closed bulb suction in the Bilateral Breasts   LOCAL MEDICATIONS USED:  1.3% Exparel (total 266 mgs.)  SPECIMEN:  Source of Specimen:  Bilateral Breasts  DISPOSITION OF SPECIMEN:  PATHOLOGY  COUNTS:  YES  DICTATION: .Note written in EPIC  PLAN OF CARE: Discharge to home after PACU  PATIENT DISPOSITION:  PACU - hemodynamically stable.   Delay start of Pharmacological VTE agent (>24hrs) due to surgical blood loss or risk of bleeding: not applicable

## 2020-02-11 NOTE — H&P (Signed)
  H&P faxed to surgical center.  -History and Physical Reviewed  -Patient has been re-examined  -No change in the plan of care  Rozalynn Buege A    

## 2020-02-11 NOTE — Transfer of Care (Signed)
Immediate Anesthesia Transfer of Care Note  Patient: Linda Richards  Procedure(s) Performed: BILATERAL MAMMARY REDUCTION  (BREAST) (Bilateral Breast)  Patient Location: PACU  Anesthesia Type:General  Level of Consciousness: awake, alert  and oriented  Airway & Oxygen Therapy: Patient Spontanous Breathing and Patient connected to face mask oxygen  Post-op Assessment: Report given to RN and Post -op Vital signs reviewed and stable  Post vital signs: Reviewed and stable  Last Vitals:  Vitals Value Taken Time  BP    Temp    Pulse 117 02/11/20 1323  Resp    SpO2 100 % 02/11/20 1323  Vitals shown include unvalidated device data.  Last Pain:  Vitals:   02/11/20 0656  TempSrc: Oral  PainSc: 0-No pain      Patients Stated Pain Goal: 2 (02/11/20 8563)  Complications: No complications documented.

## 2020-02-11 NOTE — Anesthesia Procedure Notes (Addendum)
Procedure Name: Intubation Performed by: Verita Lamb, CRNA Pre-anesthesia Checklist: Patient identified, Emergency Drugs available, Suction available and Patient being monitored Patient Re-evaluated:Patient Re-evaluated prior to induction Oxygen Delivery Method: Circle system utilized Preoxygenation: Pre-oxygenation with 100% oxygen Induction Type: IV induction Ventilation: Mask ventilation without difficulty Laryngoscope Size: Mac, 4 and Glidescope Grade View: Grade II Tube type: Oral Tube size: 7.0 mm Number of attempts: 3 Airway Equipment and Method: Stylet and Oral airway Placement Confirmation: ETT inserted through vocal cords under direct vision,  positive ETCO2,  breath sounds checked- equal and bilateral and CO2 detector Secured at: 24 cm Tube secured with: Tape Dental Injury: Teeth and Oropharynx as per pre-operative assessment  Difficulty Due To: Difficulty was unanticipated Future Recommendations: Recommend- induction with short-acting agent, and alternative techniques readily available Comments: Smooth IV induction, easy mask ventilation without oral airway.  Attempt 1 with mac 3 blade, only view of epiglottis, Attempt 2 mac 4 blade only view of ayretinoids, Attempt 3 glidescope with view of base of cords only.  Larynx very anterior and glottic opening deeper than expected.  Intubation atraumatic, lips and teeth as preop.  (one upper, front tooth noted to have small chip prior to airway manipulation)

## 2020-02-11 NOTE — Op Note (Signed)
OPERATIVE REPORT  02/11/2020  Linda Richards  PREOPERATIVE DIAGNOSIS:  Bilateral Macromastia.  POSTOPERATIVE DIAGNOSIS:  Bilateral Macromastia.  PROCEDURE:  Bilateral Reduction Mammoplasties.  ATTENDING SURGEON:  Eloise Levels, MD  ASSISTANT: Adrienne Mocha, RNFA  ANESTHESIA:  General.  ANESTHESIOLOGISTNevada Crane, MD  COMPLICATIONS:  None.  INDICATIONS FOR THE PROCEDURE:  The patient is a 34 y.o. female who has bilateral macromastia that is clinically symptomatic.  She presents to undergo bilateral reduction mammoplasties.  DESCRIPTION OF PROCEDURE:  The patient was marked in preop holding area in a pattern of Wise for the future bilateral reduction mammoplasties. She was then taken back to the OR, placed on the table in supine position.  After adequate general anesthesia was obtained, the patient's chest was prepped with Techni-Care and draped in sterile fashion.  The bases of the breasts have been infiltrated with 1% lidocaine with epinephrine.  After adequate hemostasis and anesthesia taken effect, the procedure was begun.  Both of the breast reductions were performed in the following similar manner.  The nipple-areolar complex was marked with a 45-mm nipple marker.  The skin was then incised and deepithelialized around the nipple-areolar complex down to the inframammary crease in the inferior pedicle pattern.  Next, the medial, superior, and lateral skin flaps were elevated down to the chest wall.  Excess fat and glandular tissue removed from the inferior pedicle.  The nipple-areolar complex was examined and found to be pink and viable.  The wound was irrigated with saline irrigation.  Meticulous hemostasis was obtained with the Bovie electrocautery.  Inferior pedicle was centralized using 3-0 Prolene suture.  A #10 JP flat fully fluted drain was placed into the wound. The skin flaps were brought together at the inverted T junction with a 2-0 Prolene  suture.  The incisions were stapled for temporary closure. The breasts compared and found to have good shape and symmetry.  The incisions were then closed from the medial aspect of the JP drain to the medial aspect of the [facility] incision by first placing a few 3-0 Monocryl sutures to tack together the dermal layer, and then both the dermal and cuticular layer were closed in a single layer using a 3-0 V-lock barbed suture.  Lateral to the JP drain incision was closed using 3-0 Monocryl in the dermal layer, followed by 3-0 Monocryl running intracuticular stitch on the skin.  The vertical limb of the Wise pattern was closed in the dermal layer using 3-0 Monocryl suture.  The patient was placed in the upright position.  The future location of the nipple-areolar complexes was marked on both breast mounds using the 45-mm nipple marker.  She was then placed back in the recumbent position.  Both of the nipple areolar complexes were brought out onto the breast mounds in the following similar manner.  The skin was incised as marked and removed in full thickness into the subcutaneous tissues.  The nipple- areolar complex was examined, found to be pink and viable, then brought out through this aperture and sewn in place using 4-0 Monocryl in the dermal layer, followed by 5-0 Monocryl running intracuticular stitch on the skin.  This 5-0 Monocryl suture was then brought down to close the cuticular layer of the vertical limb as well.  The JP drain was sewn in place using 3-0 nylon suture.  The pectoralis major muscle and fascia along with the breast and chest soft tissues were then infiltrated with 1% Exparel (total 266 mg).  Now the [facility] incision was  also infiltrated with the Exparel in order to give the patient postoperative pain control.  The incisions were dressed with benzoin, Steri-Strips, and the nipples dressed with bacitracin ointment and Adaptic.  4x4s were placed over the incisions and ABD pads  in the axillary areas.  The patient was placed into a light postoperative support bra.  There were no complications. The patient tolerated the procedure well.  The final needle, sponge counts were reported to be correct at the end of the case.  The patient was then recovered without complications.  Both the patient and her family were given proper postoperative wound care instructions. She was then discharged home in the care of her family in stable condition.  Follow up will be with me in a few days in the office.         Eloise Levels, M.D.  02/11/2020 1:10 PM

## 2020-02-12 ENCOUNTER — Encounter (HOSPITAL_BASED_OUTPATIENT_CLINIC_OR_DEPARTMENT_OTHER): Payer: Self-pay | Admitting: Plastic Surgery

## 2020-02-12 LAB — SURGICAL PATHOLOGY

## 2020-02-20 ENCOUNTER — Ambulatory Visit: Payer: Self-pay | Admitting: Plastic Surgery

## 2020-08-11 LAB — HM PAP SMEAR

## 2020-12-07 ENCOUNTER — Encounter: Payer: Self-pay | Admitting: Internal Medicine

## 2020-12-07 ENCOUNTER — Ambulatory Visit (INDEPENDENT_AMBULATORY_CARE_PROVIDER_SITE_OTHER): Payer: No Typology Code available for payment source | Admitting: Internal Medicine

## 2020-12-07 ENCOUNTER — Other Ambulatory Visit: Payer: Self-pay

## 2020-12-07 VITALS — BP 118/66 | HR 92 | Temp 98.5°F | Ht 62.8 in | Wt 224.4 lb

## 2020-12-07 DIAGNOSIS — Z0000 Encounter for general adult medical examination without abnormal findings: Secondary | ICD-10-CM

## 2020-12-07 DIAGNOSIS — R7309 Other abnormal glucose: Secondary | ICD-10-CM | POA: Diagnosis not present

## 2020-12-07 DIAGNOSIS — E049 Nontoxic goiter, unspecified: Secondary | ICD-10-CM | POA: Diagnosis not present

## 2020-12-07 DIAGNOSIS — Z6841 Body Mass Index (BMI) 40.0 and over, adult: Secondary | ICD-10-CM

## 2020-12-07 NOTE — Progress Notes (Addendum)
I,Katawbba Wiggins,acting as a Education administrator for Maximino Greenland, MD.,have documented all relevant documentation on the behalf of Maximino Greenland, MD,as directed by  Maximino Greenland, MD while in the presence of Maximino Greenland, MD.  This visit occurred during the SARS-CoV-2 public health emergency.  Safety protocols were in place, including screening questions prior to the visit, additional usage of staff PPE, and extensive cleaning of exam room while observing appropriate contact time as indicated for disinfecting solutions.  Subjective:     Patient ID: Linda Richards , female    DOB: 16-Jan-1986 , 35 y.o.   MRN: [id number]   Chief Complaint  Patient presents with   Annual Exam    HPI  The patient is here today for a physical examination. She is followed by Dr. Charlesetta Garibaldi for her pap smears. Her last visit was in 07/11/2020. She has no specific concerns or complaints at this time.     Past Medical History:  Diagnosis Date   Family history of adverse reaction to anesthesia    patient states mother had reaction with surgery but can not remember what     Family History  Problem Relation Age of Onset   Diabetes Mother    Diabetes Father    Hypertension Father      Current Outpatient Medications:    MELATONIN GUMMIES PO, Take by mouth Nightly., Disp: , Rfl:    Multiple Vitamin (MULTIVITAMIN) tablet, Take 1 tablet by mouth., Disp: , Rfl:    Allergies  Allergen Reactions   Penicillins Swelling      The patient states she uses none for birth control. Last LMP was Patient's last menstrual period was 11/16/2020.. Negative for Dysmenorrhea. Negative for: breast discharge, breast lump(s), breast pain and breast self exam. Associated symptoms include abnormal vaginal bleeding. Pertinent negatives include abnormal bleeding (hematology), anxiety, decreased libido, depression, difficulty falling sleep, dyspareunia, history of infertility, nocturia, sexual dysfunction, sleep disturbances, urinary  incontinence, urinary urgency, vaginal discharge and vaginal itching. Diet regular.The patient states her exercise level is  moderate.  . The patient's tobacco use is:  Social History   Tobacco Use  Smoking Status Never  Smokeless Tobacco Never  . She has been exposed to passive smoke. The patient's alcohol use is:  Social History   Substance and Sexual Activity  Alcohol Use Yes   Comment: social    Review of Systems  Constitutional: Negative.   HENT: Negative.    Eyes: Negative.   Respiratory: Negative.    Cardiovascular: Negative.   Gastrointestinal: Negative.   Endocrine: Negative.   Genitourinary: Negative.   Musculoskeletal: Negative.   Skin: Negative.   Allergic/Immunologic: Negative.   Neurological: Negative.   Hematological: Negative.   Psychiatric/Behavioral: Negative.    All other systems reviewed and are negative.   Today's Vitals   12/07/20 1434  BP: 118/66  Pulse: 92  Temp: 98.5 F (36.9 C)  TempSrc: Oral  Weight: 224 lb 6.4 oz (101.8 kg)  Height: 5' 2.8" (1.595 m)   Body mass index is 40 kg/m.  Wt Readings from Last 3 Encounters:  12/07/20 224 lb 6.4 oz (101.8 kg)  02/11/20 225 lb 1.4 oz (102.1 kg)  12/02/19 232 lb 3.2 oz (105.3 kg)     Objective:  Physical Exam Vitals and nursing note reviewed.  Constitutional:      Appearance: Normal appearance. She is obese.  HENT:     Head: Normocephalic and atraumatic.     Right Ear: Tympanic membrane, ear canal and  external ear normal.     Left Ear: Tympanic membrane, ear canal and external ear normal.     Nose:     Comments: Masked     Mouth/Throat:     Comments: Masked  Eyes:     Extraocular Movements: Extraocular movements intact.     Conjunctiva/sclera: Conjunctivae normal.     Pupils: Pupils are equal, round, and reactive to light.  Neck:     Thyroid: Thyromegaly present.  Cardiovascular:     Rate and Rhythm: Normal rate and regular rhythm.     Pulses: Normal pulses.     Heart sounds:  Normal heart sounds.  Pulmonary:     Effort: Pulmonary effort is normal.     Breath sounds: Normal breath sounds.  Chest:  Breasts:    Tanner Score is 5.     Right: Normal.     Left: Normal.     Comments: B/l healed surgical scars Abdominal:     General: Bowel sounds are normal.     Palpations: Abdomen is soft.     Comments: Obese, soft  Genitourinary:    Comments: deferred Musculoskeletal:        General: Normal range of motion.     Cervical back: Normal range of motion and neck supple.  Skin:    General: Skin is warm and dry.  Neurological:     General: No focal deficit present.     Mental Status: She is alert and oriented to person, place, and time.  Psychiatric:        Mood and Affect: Mood normal.        Behavior: Behavior normal.        Assessment And Plan:     1. Routine general medical examination at health care facility Comments: A full exam was performed. Importance of monthly self breast exams was discussed with the patient. PATIENT IS ADVISED TO GET 30-45 MINUTES REGULAR EXERCISE NO LESS THAN FOUR TO FIVE DAYS PER WEEK - BOTH WEIGHTBEARING EXERCISES AND AEROBIC ARE RECOMMENDED.  PATIENT IS ADVISED TO FOLLOW A HEALTHY DIET WITH AT LEAST SIX FRUITS/VEGGIES PER DAY, DECREASE INTAKE OF RED MEAT, AND TO INCREASE FISH INTAKE TO TWO DAYS PER WEEK.  MEATS/FISH SHOULD NOT BE FRIED, BAKED OR BROILED IS PREFERABLE.  IT IS ALSO IMPORTANT TO CUT BACK ON YOUR SUGAR INTAKE. PLEASE AVOID ANYTHING WITH ADDED SUGAR, CORN SYRUP OR OTHER SWEETENERS. IF YOU MUST USE A SWEETENER, YOU CAN TRY STEVIA. IT IS ALSO IMPORTANT TO AVOID ARTIFICIALLY SWEETENERS AND DIET BEVERAGES. LASTLY, I SUGGEST WEARING SPF 50 SUNSCREEN ON EXPOSED PARTS AND ESPECIALLY WHEN IN THE DIRECT SUNLIGHT FOR AN EXTENDED PERIOD OF TIME.  PLEASE AVOID FAST FOOD RESTAURANTS AND INCREASE YOUR WATER INTAKE.  - CBC - CMP14+EGFR - Lipid panel - TSH  2. Other abnormal glucose Comments: Her a1c has been elevated in the past,  also w/ FHx diabetes. I will recheck this today. Encouraged to limit intake of processed meats/sugary beverages.  - Hemoglobin A1c - Insulin, random(561)  3. Class 3 severe obesity due to excess calories without serious comorbidity with body mass index (BMI) of 40.0 to 44.9 in adult (HCC) BMI 40. She is encouraged to strive to lose 5-10% of her body weight to decrease cardiac risk.  Encouraged to incorporate more strength training into her exercise routine.   4. Goiter I will refer her for thyroid ultrasound.   Patient was given opportunity to ask questions. Patient verbalized understanding of the plan and was able  to repeat key elements of the plan. All questions were answered to their satisfaction.   I, Maximino Greenland, MD, have reviewed all documentation for this visit. The documentation on 12/07/20 for the exam, diagnosis, procedures, and orders are all accurate and complete.   THE PATIENT IS ENCOURAGED TO PRACTICE SOCIAL DISTANCING DUE TO THE COVID-19 PANDEMIC.

## 2020-12-07 NOTE — Patient Instructions (Signed)
Health Maintenance, Female Adopting a healthy lifestyle and getting preventive care are important in promoting health and wellness. Ask your health care provider about: The right schedule for you to have regular tests and exams. Things you can do on your own to prevent diseases and keep yourself healthy. What should I know about diet, weight, and exercise? Eat a healthy diet  Eat a diet that includes plenty of vegetables, fruits, low-fat dairy products, and lean protein. Do not eat a lot of foods that are high in solid fats, added sugars, or sodium.  Maintain a healthy weight Body mass index (BMI) is used to identify weight problems. It estimates body fat based on height and weight. Your health care provider can help determineyour BMI and help you achieve or maintain a healthy weight. Get regular exercise Get regular exercise. This is one of the most important things you can do for your health. Most adults should: Exercise for at least 150 minutes each week. The exercise should increase your heart rate and make you sweat (moderate-intensity exercise). Do strengthening exercises at least twice a week. This is in addition to the moderate-intensity exercise. Spend less time sitting. Even light physical activity can be beneficial. Watch cholesterol and blood lipids Have your blood tested for lipids and cholesterol at 34 years of age, then havethis test every 5 years. Have your cholesterol levels checked more often if: Your lipid or cholesterol levels are high. You are older than 34 years of age. You are at high risk for heart disease. What should I know about cancer screening? Depending on your health history and family history, you may need to have cancer screening at various ages. This may include screening for: Breast cancer. Cervical cancer. Colorectal cancer. Skin cancer. Lung cancer. What should I know about heart disease, diabetes, and high blood pressure? Blood pressure and heart  disease High blood pressure causes heart disease and increases the risk of stroke. This is more likely to develop in people who have high blood pressure readings, are of African descent, or are overweight. Have your blood pressure checked: Every 3-5 years if you are 18-39 years of age. Every year if you are 40 years old or older. Diabetes Have regular diabetes screenings. This checks your fasting blood sugar level. Have the screening done: Once every three years after age 40 if you are at a normal weight and have a low risk for diabetes. More often and at a younger age if you are overweight or have a high risk for diabetes. What should I know about preventing infection? Hepatitis B If you have a higher risk for hepatitis B, you should be screened for this virus. Talk with your health care provider to find out if you are at risk forhepatitis B infection. Hepatitis C Testing is recommended for: Everyone born from 1945 through 1965. Anyone with known risk factors for hepatitis C. Sexually transmitted infections (STIs) Get screened for STIs, including gonorrhea and chlamydia, if: You are sexually active and are younger than 34 years of age. You are older than 34 years of age and your health care provider tells you that you are at risk for this type of infection. Your sexual activity has changed since you were last screened, and you are at increased risk for chlamydia or gonorrhea. Ask your health care provider if you are at risk. Ask your health care provider about whether you are at high risk for HIV. Your health care provider may recommend a prescription medicine to help   prevent HIV infection. If you choose to take medicine to prevent HIV, you should first get tested for HIV. You should then be tested every 3 months for as long as you are taking the medicine. Pregnancy If you are about to stop having your period (premenopausal) and you may become pregnant, seek counseling before you get  pregnant. Take 400 to 800 micrograms (mcg) of folic acid every day if you become pregnant. Ask for birth control (contraception) if you want to prevent pregnancy. Osteoporosis and menopause Osteoporosis is a disease in which the bones lose minerals and strength with aging. This can result in bone fractures. If you are 65 years old or older, or if you are at risk for osteoporosis and fractures, ask your health care provider if you should: Be screened for bone loss. Take a calcium or vitamin D supplement to lower your risk of fractures. Be given hormone replacement therapy (HRT) to treat symptoms of menopause. Follow these instructions at home: Lifestyle Do not use any products that contain nicotine or tobacco, such as cigarettes, e-cigarettes, and chewing tobacco. If you need help quitting, ask your health care provider. Do not use street drugs. Do not share needles. Ask your health care provider for help if you need support or information about quitting drugs. Alcohol use Do not drink alcohol if: Your health care provider tells you not to drink. You are pregnant, may be pregnant, or are planning to become pregnant. If you drink alcohol: Limit how much you use to 0-1 drink a day. Limit intake if you are breastfeeding. Be aware of how much alcohol is in your drink. In the [place] one drink equals one 12 oz bottle of beer (355 mL), one 5 oz glass of wine (148 mL), or one 1 oz glass of hard liquor (44 mL). General instructions Schedule regular health, dental, and eye exams. Stay current with your vaccines. Tell your health care provider if: You often feel depressed. You have ever been abused or do not feel safe at home. Summary Adopting a healthy lifestyle and getting preventive care are important in promoting health and wellness. Follow your health care provider's instructions about healthy diet, exercising, and getting tested or screened for diseases. Follow your health care provider's  instructions on monitoring your cholesterol and blood pressure. This information is not intended to replace advice given to you by your health care provider. Make sure you discuss any questions you have with your healthcare provider. Document Revised: 03/21/2018 Document Reviewed: 03/21/2018 Elsevier Patient Education  2022 [employer]  

## 2020-12-08 LAB — LIPID PANEL
Chol/HDL Ratio: 4 ratio (ref 0.0–4.4)
Cholesterol, Total: 178 mg/dL (ref 100–199)
HDL: 45 mg/dL (ref >39)
LDL Chol Calc (NIH): 97 mg/dL (ref 0–99)
Triglycerides: 214 mg/dL — ABNORMAL HIGH (ref 0–149)
VLDL Cholesterol Cal: 36 mg/dL (ref 5–40)

## 2020-12-08 LAB — CBC
Hematocrit: 45.9 % (ref 34.0–46.6)
Hemoglobin: 15.2 g/dL (ref 11.1–15.9)
MCH: 31.1 pg (ref 26.6–33.0)
MCHC: 33.1 g/dL (ref 31.5–35.7)
MCV: 94 fL (ref 79–97)
Platelets: 243 10*3/uL (ref 150–450)
RBC: 4.88 x10E6/uL (ref 3.77–5.28)
RDW: 12.5 % (ref 11.7–15.4)
WBC: 9.6 10*3/uL (ref 3.4–10.8)

## 2020-12-08 LAB — CMP14+EGFR
ALT: 17 IU/L (ref 0–32)
AST: 20 IU/L (ref 0–40)
Albumin/Globulin Ratio: 1.6 (ref 1.2–2.2)
Albumin: 4.4 g/dL (ref 3.8–4.8)
Alkaline Phosphatase: 56 IU/L (ref 44–121)
BUN/Creatinine Ratio: 15 (ref 9–23)
BUN: 13 mg/dL (ref 6–20)
Bilirubin Total: 0.4 mg/dL (ref 0.0–1.2)
CO2: 19 mmol/L — ABNORMAL LOW (ref 20–29)
Calcium: 9.8 mg/dL (ref 8.7–10.2)
Chloride: 102 mmol/L (ref 96–106)
Creatinine, Ser: 0.89 mg/dL (ref 0.57–1.00)
Globulin, Total: 2.8 g/dL (ref 1.5–4.5)
Glucose: 89 mg/dL (ref 65–99)
Potassium: 4.3 mmol/L (ref 3.5–5.2)
Sodium: 137 mmol/L (ref 134–144)
Total Protein: 7.2 g/dL (ref 6.0–8.5)
eGFR: 87 mL/min/{1.73_m2} (ref >59)

## 2020-12-08 LAB — INSULIN, RANDOM: INSULIN: 73.5 u[IU]/mL — ABNORMAL HIGH (ref 2.6–24.9)

## 2020-12-08 LAB — HEMOGLOBIN A1C
Est. average glucose Bld gHb Est-mCnc: 117 mg/dL
Hgb A1c MFr Bld: 5.7 % — ABNORMAL HIGH (ref 4.8–5.6)

## 2020-12-08 LAB — TSH: TSH: 1.63 u[IU]/mL (ref 0.450–4.500)

## 2020-12-08 NOTE — Addendum Note (Signed)
Addended by: Gwynneth Aliment on: 12/08/2020 10:56 PM   Modules accepted: Orders

## 2020-12-11 ENCOUNTER — Ambulatory Visit
Admission: RE | Admit: 2020-12-11 | Discharge: 2020-12-11 | Disposition: A | Discharge status: Home / Self Care | Payer: No Typology Code available for payment source | Source: Ambulatory Visit | Attending: Internal Medicine | Admitting: Internal Medicine

## 2020-12-11 DIAGNOSIS — E049 Nontoxic goiter, unspecified: Secondary | ICD-10-CM

## 2021-12-08 ENCOUNTER — Encounter: Payer: Self-pay | Admitting: Internal Medicine

## 2021-12-08 ENCOUNTER — Ambulatory Visit (INDEPENDENT_AMBULATORY_CARE_PROVIDER_SITE_OTHER): Payer: No Typology Code available for payment source | Admitting: Internal Medicine

## 2021-12-08 VITALS — BP 120/62 | HR 87 | Temp 98.6°F | Ht 62.0 in | Wt 225.8 lb

## 2021-12-08 DIAGNOSIS — Z6841 Body Mass Index (BMI) 40.0 and over, adult: Secondary | ICD-10-CM

## 2021-12-08 DIAGNOSIS — Z0000 Encounter for general adult medical examination without abnormal findings: Secondary | ICD-10-CM

## 2021-12-08 DIAGNOSIS — R5383 Other fatigue: Secondary | ICD-10-CM

## 2021-12-08 DIAGNOSIS — H9313 Tinnitus, bilateral: Secondary | ICD-10-CM

## 2021-12-08 NOTE — Patient Instructions (Signed)

## 2021-12-08 NOTE — Progress Notes (Signed)
I,[name],acting as a scribe for Maximino Greenland, MD.,have documented all relevant documentation on the behalf of Maximino Greenland, MD,as directed by  Maximino Greenland, MD while in the presence of Maximino Greenland, MD.    Subjective:     Patient ID: Linda Richards , female    DOB: 06-Dec-1985 , 36 y.o.   MRN: [id number]   Chief Complaint  Patient presents with   Annual Exam    HPI  The patient is here today for a physical examination. She is followed by Dr. Charlesetta Garibaldi for her pap smears. Her last visit was in 07/11/2020. She has no specific concerns or complaints at this time.   BP Readings from Last 3 Encounters: 12/08/21 : 120/62 12/07/20 : 118/66 02/11/20 : 115/63       Past Medical History:  Diagnosis Date   Family history of adverse reaction to anesthesia    patient states mother had reaction with surgery but can not remember what     Family History  Problem Relation Age of Onset   Diabetes Mother    Diabetes Father    Hypertension Father    Cancer Paternal Grandmother      Current Outpatient Medications:    MELATONIN GUMMIES PO, Take by mouth Nightly., Disp: , Rfl:    Multiple Vitamin (MULTIVITAMIN) tablet, Take 1 tablet by mouth., Disp: , Rfl:    Allergies  Allergen Reactions   Penicillins Swelling     Review of Systems  Constitutional: Negative.   HENT:  Positive for tinnitus.        She c/o tinnitus in both ears. She is not sure what is triggered her sx. Denies hearing loss. No drainage from ears. No recent head injuries.   Eyes: Negative.   Respiratory: Negative.    Cardiovascular: Negative.   Endocrine: Negative.   Genitourinary: Negative.   Musculoskeletal: Negative.   Skin: Negative.   Allergic/Immunologic: Negative.   Neurological: Negative.   Hematological: Negative.   Psychiatric/Behavioral: Negative.       Today's Vitals   12/08/21 1419  BP: 120/62  Pulse: 87  Temp: 98.6 F (37 C)  TempSrc: Oral  Weight: 225 lb 12.8 oz  (102.4 kg)  Height: $Remove'5\' 2"'uasFocI$  (1.575 m)  PainSc: 0-No pain   Body mass index is 41.3 kg/m.  Wt Readings from Last 3 Encounters:  12/08/21 225 lb 12.8 oz (102.4 kg)  12/07/20 224 lb 6.4 oz (101.8 kg)  02/11/20 225 lb 1.4 oz (102.1 kg)     Objective:  Physical Exam Vitals and nursing note reviewed.  Constitutional:      Appearance: Normal appearance. She is obese.  HENT:     Head: Normocephalic and atraumatic.     Right Ear: Tympanic membrane, ear canal and external ear normal.     Left Ear: Tympanic membrane, ear canal and external ear normal.     Nose: Nose normal.     Mouth/Throat:     Mouth: Mucous membranes are moist.     Pharynx: Oropharynx is clear.  Eyes:     Extraocular Movements: Extraocular movements intact.     Conjunctiva/sclera: Conjunctivae normal.     Pupils: Pupils are equal, round, and reactive to light.  Cardiovascular:     Rate and Rhythm: Normal rate and regular rhythm.     Pulses: Normal pulses.     Heart sounds: Normal heart sounds.  Pulmonary:     Effort: Pulmonary effort is normal.     Breath sounds:  Normal breath sounds.  Chest:  Breasts:    Tanner Score is 5.     Right: Normal.     Left: Normal.     Comments: Well healed surgical scars b/l Abdominal:     General: Bowel sounds are normal.     Palpations: Abdomen is soft.     Comments: Rounded, soft  Genitourinary:    Comments: deferred Musculoskeletal:        General: Normal range of motion.     Cervical back: Normal range of motion and neck supple.  Skin:    General: Skin is warm and dry.  Neurological:     General: No focal deficit present.     Mental Status: She is alert and oriented to person, place, and time.  Psychiatric:        Mood and Affect: Mood normal.        Behavior: Behavior normal.      Assessment And Plan:     1. Routine general medical examination at health care facility Comments: A full exam was performed. Importance of monthly self breast exams was discussed with  the patient.  PATIENT IS ADVISED TO GET 30-45 MINUTES REGULAR EXERCISE NO LESS THAN FOUR TO FIVE DAYS PER WEEK - BOTH WEIGHTBEARING EXERCISES AND AEROBIC ARE RECOMMENDED.  PATIENT IS ADVISED TO FOLLOW A HEALTHY DIET WITH AT LEAST SIX FRUITS/VEGGIES PER DAY, DECREASE INTAKE OF RED MEAT, AND TO INCREASE FISH INTAKE TO TWO DAYS PER WEEK.  MEATS/FISH SHOULD NOT BE FRIED, BAKED OR BROILED IS PREFERABLE.  IT IS ALSO IMPORTANT TO CUT BACK ON YOUR SUGAR INTAKE. PLEASE AVOID ANYTHING WITH ADDED SUGAR, CORN SYRUP OR OTHER SWEETENERS. IF YOU MUST USE A SWEETENER, YOU CAN TRY STEVIA. IT IS ALSO IMPORTANT TO AVOID ARTIFICIALLY SWEETENERS AND DIET BEVERAGES. LASTLY, I SUGGEST WEARING SPF 50 SUNSCREEN ON EXPOSED PARTS AND ESPECIALLY WHEN IN THE DIRECT SUNLIGHT FOR AN EXTENDED PERIOD OF TIME.  PLEASE AVOID FAST FOOD RESTAURANTS AND INCREASE YOUR WATER INTAKE. - CMP14+EGFR - CBC - Hemoglobin A1c - Lipid panel - HIV antibody (with reflex)  2. Other fatigue Comments: Chronic, I will check labs as below. She is encouraged to stay well hydrated, avoid processed foods and increase her intake of whole foods.  - Vitamin B12 - TSH - Iron, TIBC and Ferritin Panel  3. Tinnitus of both ears Comments: Declines ENT eval at this time.  She will let me know if her sx persist.   4. Class 3 severe obesity due to excess calories without serious comorbidity with body mass index (BMI) of 40.0 to 44.9 in adult [facility]) Comments: BMI 41. She is encouraged to initially strive for BMI<35 to decrease cardiac risk, while aiming for at least 150 minutes of exercise per week.    Patient was given opportunity to ask questions. Patient verbalized understanding of the plan and was able to repeat key elements of the plan. All questions were answered to their satisfaction.   I, Maximino Greenland, MD, have reviewed all documentation for this visit. The documentation on 12/08/21 for the exam, diagnosis, procedures, and orders are all accurate and  complete.   IF YOU HAVE BEEN REFERRED TO A SPECIALIST, IT MAY TAKE 1-2 WEEKS TO SCHEDULE/PROCESS THE REFERRAL. IF YOU HAVE NOT HEARD FROM US/SPECIALIST IN TWO WEEKS, PLEASE GIVE [place] A CALL AT [phone].   THE PATIENT IS ENCOURAGED TO PRACTICE SOCIAL DISTANCING DUE TO THE COVID-19 PANDEMIC.

## 2021-12-09 LAB — CMP14+EGFR
ALT: 15 IU/L (ref 0–32)
AST: 18 IU/L (ref 0–40)
Albumin/Globulin Ratio: 1.8 (ref 1.2–2.2)
Albumin: 4.7 g/dL (ref 3.9–4.9)
Alkaline Phosphatase: 57 IU/L (ref 44–121)
BUN/Creatinine Ratio: 9 (ref 9–23)
BUN: 9 mg/dL (ref 6–20)
Bilirubin Total: 0.4 mg/dL (ref 0.0–1.2)
CO2: 24 mmol/L (ref 20–29)
Calcium: 9.3 mg/dL (ref 8.7–10.2)
Chloride: 105 mmol/L (ref 96–106)
Creatinine, Ser: 0.96 mg/dL (ref 0.57–1.00)
Globulin, Total: 2.6 g/dL (ref 1.5–4.5)
Glucose: 93 mg/dL (ref 70–99)
Potassium: 4.6 mmol/L (ref 3.5–5.2)
Sodium: 138 mmol/L (ref 134–144)
Total Protein: 7.3 g/dL (ref 6.0–8.5)
eGFR: 79 mL/min/{1.73_m2} (ref >59)

## 2021-12-09 LAB — CBC
Hematocrit: 43.9 % (ref 34.0–46.6)
Hemoglobin: 14.8 g/dL (ref 11.1–15.9)
MCH: 31.5 pg (ref 26.6–33.0)
MCHC: 33.7 g/dL (ref 31.5–35.7)
MCV: 93 fL (ref 79–97)
Platelets: 271 10*3/uL (ref 150–450)
RBC: 4.7 x10E6/uL (ref 3.77–5.28)
RDW: 12.8 % (ref 11.7–15.4)
WBC: 7.1 10*3/uL (ref 3.4–10.8)

## 2021-12-09 LAB — IRON,TIBC AND FERRITIN PANEL
Ferritin: 56 ng/mL (ref 15–150)
Iron Saturation: 20 % (ref 15–55)
Iron: 74 ug/dL (ref 27–159)
Total Iron Binding Capacity: 378 ug/dL (ref 250–450)
UIBC: 304 ug/dL (ref 131–425)

## 2021-12-09 LAB — HEMOGLOBIN A1C
Est. average glucose Bld gHb Est-mCnc: 126 mg/dL
Hgb A1c MFr Bld: 6 % — ABNORMAL HIGH (ref 4.8–5.6)

## 2021-12-09 LAB — VITAMIN B12: Vitamin B-12: 611 pg/mL (ref 232–1245)

## 2021-12-09 LAB — TSH: TSH: 1.15 u[IU]/mL (ref 0.450–4.500)

## 2021-12-09 LAB — LIPID PANEL
Chol/HDL Ratio: 3.3 ratio (ref 0.0–4.4)
Cholesterol, Total: 178 mg/dL (ref 100–199)
HDL: 54 mg/dL (ref >39)
LDL Chol Calc (NIH): 107 mg/dL — ABNORMAL HIGH (ref 0–99)
Triglycerides: 96 mg/dL (ref 0–149)
VLDL Cholesterol Cal: 17 mg/dL (ref 5–40)

## 2021-12-09 LAB — HIV ANTIBODY (ROUTINE TESTING W REFLEX): HIV Screen 4th Generation wRfx: NONREACTIVE

## 2022-06-09 ENCOUNTER — Encounter: Payer: Self-pay | Admitting: Internal Medicine

## 2022-06-09 ENCOUNTER — Ambulatory Visit (INDEPENDENT_AMBULATORY_CARE_PROVIDER_SITE_OTHER): Payer: 59 | Admitting: Internal Medicine

## 2022-06-09 VITALS — BP 126/82 | HR 90 | Temp 98.1°F | Ht 62.0 in | Wt 230.0 lb

## 2022-06-09 DIAGNOSIS — R7303 Prediabetes: Secondary | ICD-10-CM

## 2022-06-09 DIAGNOSIS — R350 Frequency of micturition: Secondary | ICD-10-CM | POA: Diagnosis not present

## 2022-06-09 DIAGNOSIS — Z6841 Body Mass Index (BMI) 40.0 and over, adult: Secondary | ICD-10-CM | POA: Diagnosis not present

## 2022-06-09 NOTE — Patient Instructions (Addendum)
Coronary Calcium Scan A coronary calcium scan is an imaging test used to look for deposits of plaque in the inner lining of the blood vessels of the heart (coronary arteries). Plaque is made up of calcium, protein, and fatty substances. These deposits of plaque can partly clog and narrow the coronary arteries without producing any symptoms or warning signs. This puts a person at risk for a heart attack. A coronary calcium scan is performed using a computed tomography (CT) scanner machine without using a dye (contrast). This test is recommended for people who are at moderate risk for heart disease. The test can find plaque deposits before symptoms develop. Tell a health care provider about: Any allergies you have. All medicines you are taking, including vitamins, herbs, eye drops, creams, and over-the-counter medicines. Any problems you or family members have had with anesthetic medicines. Any bleeding problems you have. Any surgeries you have had. Any medical conditions you have. Whether you are pregnant or may be pregnant. What are the risks? Generally, this is a safe procedure. However, problems may occur, including: Harm to a pregnant woman and her unborn baby. This test involves the use of radiation. Radiation exposure can be dangerous to a pregnant woman and her unborn baby. If you are pregnant or think you may be pregnant, you should not have this procedure done. A slight increase in the risk of cancer. This is because of the radiation involved in the test. The amount of radiation from one test is similar to the amount of radiation you are naturally exposed to over one year. What happens before the procedure? Ask your health care provider for any specific instructions on how to prepare for this procedure. You may be asked to avoid products that contain caffeine, tobacco, or nicotine for 4 hours before the procedure. What happens during the procedure?  You will undress and remove any jewelry  from your neck or chest. You may need to remove hearing aides and dentures. Women may need to remove their bras. You will put on a hospital gown. Sticky electrodes will be placed on your chest. The electrodes will be connected to an electrocardiogram (ECG) machine to record a tracing of the electrical activity of your heart. You will lie down on your back on a curved bed that is attached to the [facility]. You may be given medicine to slow down your heart rate so that clear pictures can be created. You will be moved into the CT scanner, and the CT scanner will take pictures of your heart. During this time, you will be asked to lie still and hold your breath for 10-20 seconds at a time while each picture of your heart is being taken. The procedure may vary among health care providers and hospitals. What can I expect after the procedure? You can return to your normal activities. It is up to you to get the results of your procedure. Ask your health care provider, or the department that is doing the procedure, when your results will be ready. Summary A coronary calcium scan is an imaging test used to look for deposits of plaque in the inner lining of the blood vessels of the heart. Plaque is made up of calcium, protein, and fatty substances. A coronary calcium scan is performed using a CT scanner machine without contrast. Generally, this is a safe procedure. Tell your health care provider if you are pregnant or may be pregnant. Ask your health care provider for any specific instructions on how to  prepare for this procedure. You can return to your normal activities after the scan is done. This information is not intended to replace advice given to you by your health care provider. Make sure you discuss any questions you have with your health care provider. Document Revised: 03/07/2021 Document Reviewed: 03/07/2021 Elsevier Patient Education  [place].   Prediabetes Prediabetes is when your  blood sugar (blood glucose) level is higher than normal but not high enough for you to be diagnosed with type 2 diabetes. Having prediabetes puts you at risk for developing type 2 diabetes (type 2 diabetes mellitus). With certain lifestyle changes, you may be able to prevent or delay the onset of type 2 diabetes. This is important because type 2 diabetes can lead to serious complications, such as: Heart disease. Stroke. Blindness. Kidney disease. Depression. Poor circulation in the feet and legs. In severe cases, this could lead to surgical removal of a leg (amputation). What are the causes? The exact cause of prediabetes is not known. It may result from insulin resistance. Insulin resistance develops when cells in the body do not respond properly to insulin that the body makes. This can cause excess glucose to build up in the blood. High blood glucose (hyperglycemia) can develop. What increases the risk? The following factors may make you more likely to develop this condition: You have a family member with type 2 diabetes. You are older than 45 years. You had a temporary form of diabetes during a pregnancy (gestational diabetes). You had polycystic ovary syndrome (PCOS). You are overweight or obese. You are inactive (sedentary). You have a history of heart disease, including problems with cholesterol levels, high levels of blood fats, or high blood pressure. What are the signs or symptoms? You may have no symptoms. If you do have symptoms, they may include: Increased hunger. Increased thirst. Increased urination. Vision changes, such as blurry vision. Tiredness (fatigue). How is this diagnosed? This condition can be diagnosed with blood tests. Your blood glucose may be checked with one or more of the following tests: A fasting blood glucose (FBG) test. You will not be allowed to eat (you will fast) for at least 8 hours before a blood sample is taken. An A1C blood test (hemoglobin A1C).  This test provides information about blood glucose levels over the previous 2?3 months. An oral glucose tolerance test (OGTT). This test measures your blood glucose at two points in time: After fasting. This is your baseline level. Two hours after you drink a beverage that contains glucose. You may be diagnosed with prediabetes if: Your FBG is 100?125 mg/dL (5.6-6.9 mmol/L). Your A1C level is 5.7?6.4% (39-46 mmol/mol). Your OGTT result is 140?199 mg/dL (7.8-11 mmol/L). These blood tests may be repeated to confirm your diagnosis. How is this treated? Treatment may include dietary and lifestyle changes to help lower your blood glucose and prevent type 2 diabetes from developing. In some cases, medicine may be prescribed to help lower the risk of type 2 diabetes. Follow these instructions at home: Nutrition  Follow a healthy meal plan. This includes eating lean proteins, whole grains, legumes, fresh fruits and vegetables, low-fat dairy products, and healthy fats. Follow instructions from your health care provider about eating or drinking restrictions. Meet with a dietitian to create a healthy eating plan that is right for you. Lifestyle Do moderate-intensity exercise for at least 30 minutes a day on 5 or more days each week, or as told by your health care provider. A mix of activities  may be best, such as: Brisk walking, swimming, biking, and weight lifting. Lose weight as told by your health care provider. Losing 5-7% of your body weight can reverse insulin resistance. Do not drink alcohol if: Your health care provider tells you not to drink. You are pregnant, may be pregnant, or are planning to become pregnant. If you drink alcohol: Limit how much you use to: 0-1 drink a day for women. 0-2 drinks a day for men. Be aware of how much alcohol is in your drink. In the [place] one drink equals one 12 oz bottle of beer (355 mL), one 5 oz glass of wine (148 mL), or one 1 oz glass of hard liquor  (44 mL). General instructions Take over-the-counter and prescription medicines only as told by your health care provider. You may be prescribed medicines that help lower the risk of type 2 diabetes. Do not use any products that contain nicotine or tobacco, such as cigarettes, e-cigarettes, and chewing tobacco. If you need help quitting, ask your health care provider. Keep all follow-up visits. This is important. Where to find more information American Diabetes Association: www.diabetes.org Academy of Nutrition and Dietetics: www.eatright.org American Heart Association: www.heart.org Contact a health care provider if: You have any of these symptoms: Increased hunger. Increased urination. Increased thirst. Fatigue. Vision changes, such as blurry vision. Get help right away if you: Have shortness of breath. Feel confused. Vomit or feel like you may vomit. Summary Prediabetes is when your blood sugar (blood glucose)level is higher than normal but not high enough for you to be diagnosed with type 2 diabetes. Having prediabetes puts you at risk for developing type 2 diabetes (type 2 diabetes mellitus). Make lifestyle changes such as eating a healthy diet and exercising regularly to help prevent diabetes. Lose weight as told by your health care provider. This information is not intended to replace advice given to you by your health care provider. Make sure you discuss any questions you have with your health care provider. Document Revised: 06/27/2019 Document Reviewed: 06/27/2019 Elsevier Patient Education  [place].

## 2022-06-09 NOTE — Progress Notes (Signed)
I,Linda Richards,acting as a scribe for Linda Greenland, MD.,have documented all relevant documentation on the behalf of Linda Greenland, MD,as directed by  Linda Greenland, MD while in the presence of Linda Greenland, MD.    Subjective:     Patient ID: Linda Richards , female    DOB: February 17, 1986 , 37 y.o.   MRN: [id number]   Chief Complaint  Patient presents with   Prediabetes    HPI  Pt presents today for prediabetes follow up. She denies having any specific questions or concerns at this time.      Past Medical History:  Diagnosis Date   Family history of adverse reaction to anesthesia    patient states mother had reaction with surgery but can not remember what     Family History  Problem Relation Age of Onset   Diabetes Mother    Diabetes Father    Hypertension Father    Cancer Paternal Grandmother      Current Outpatient Medications:    MELATONIN GUMMIES PO, Take by mouth Nightly., Disp: , Rfl:    Multiple Vitamin (MULTIVITAMIN) tablet, Take 1 tablet by mouth., Disp: , Rfl:    Allergies  Allergen Reactions   Penicillins Swelling     Review of Systems  Constitutional: Negative.   Respiratory: Negative.    Cardiovascular: Negative.   Gastrointestinal: Negative.   Neurological: Negative.   Psychiatric/Behavioral: Negative.       Today's Vitals   06/09/22 1424  BP: 126/82  Pulse: 90  Temp: 98.1 F (36.7 C)  SpO2: 98%  Weight: 230 lb (104.3 kg)  Height: 5\' 2"  (1.575 m)   Body mass index is 42.07 kg/m.  Wt Readings from Last 3 Encounters:  06/09/22 230 lb (104.3 kg)  12/08/21 225 lb 12.8 oz (102.4 kg)  12/07/20 224 lb 6.4 oz (101.8 kg)    Objective:  Physical Exam Vitals and nursing note reviewed.  Constitutional:      Appearance: Normal appearance.  HENT:     Head: Normocephalic and atraumatic.     Nose:     Comments: Masked     Mouth/Throat:     Comments: Masked  Eyes:     Extraocular Movements: Extraocular movements intact.   Cardiovascular:     Rate and Rhythm: Normal rate and regular rhythm.     Heart sounds: Normal heart sounds.  Pulmonary:     Effort: Pulmonary effort is normal.     Breath sounds: Normal breath sounds.  Musculoskeletal:     Cervical back: Normal range of motion.  Skin:    General: Skin is warm.  Neurological:     General: No focal deficit present.     Mental Status: She is alert.  Psychiatric:        Mood and Affect: Mood normal.        Behavior: Behavior normal.    Assessment And Plan:     1. Prediabetes Comments: I will check labs as below. She is encouraged to decrease her intake of sugary beverages/foods and processed meats. - Hemoglobin A1c - BMP8+EGFR - Insulin, random(561)  2. Urinary frequency Comments: I will check u/a to r/o UTI.  This can also be caused by hyperglycemia. - POCT Urinalysis Dipstick (81002)  3. Class 3 severe obesity due to excess calories without serious comorbidity with body mass index (BMI) of 40.0 to 44.9 in adult (HCC) BMI 42. She is aware of recent weight gain. She is encouraged to initially strive for  BMI less than 37 to decrease cardiac risk. Advised to aim for at least 150 minutes of exercise per week. May consider GLP-1 agonist therapy in the future. She denies family h/o thyroid cancer.    Patient was given opportunity to ask questions. Patient verbalized understanding of the plan and was able to repeat key elements of the plan. All questions were answered to their satisfaction.    I, Linda Greenland, MD, have reviewed all documentation for this visit. The documentation on 06/09/22 for the exam, diagnosis, procedures, and orders are all accurate and complete.    IF YOU HAVE BEEN REFERRED TO A SPECIALIST, IT MAY TAKE 1-2 WEEKS TO SCHEDULE/PROCESS THE REFERRAL. IF YOU HAVE NOT HEARD FROM US/SPECIALIST IN TWO WEEKS, PLEASE GIVE [place] A CALL AT [phone].   THE PATIENT IS ENCOURAGED TO PRACTICE SOCIAL DISTANCING DUE TO THE COVID-19  PANDEMIC.

## 2022-06-10 LAB — BMP8+EGFR
BUN/Creatinine Ratio: 9 (ref 9–23)
BUN: 9 mg/dL (ref 6–20)
CO2: 23 mmol/L (ref 20–29)
Calcium: 9.2 mg/dL (ref 8.7–10.2)
Chloride: 105 mmol/L (ref 96–106)
Creatinine, Ser: 0.99 mg/dL (ref 0.57–1.00)
Glucose: 87 mg/dL (ref 70–99)
Potassium: 4.5 mmol/L (ref 3.5–5.2)
Sodium: 141 mmol/L (ref 134–144)
eGFR: 76 mL/min/{1.73_m2} (ref >59)

## 2022-06-10 LAB — HEMOGLOBIN A1C
Est. average glucose Bld gHb Est-mCnc: 123 mg/dL
Hgb A1c MFr Bld: 5.9 % — ABNORMAL HIGH (ref 4.8–5.6)

## 2022-06-10 LAB — INSULIN, RANDOM: INSULIN: 91.5 u[IU]/mL — ABNORMAL HIGH (ref 2.6–24.9)

## 2022-06-14 ENCOUNTER — Encounter: Payer: Self-pay | Admitting: Internal Medicine

## 2022-06-14 LAB — POCT URINALYSIS DIPSTICK
Bilirubin, UA: NEGATIVE
Blood, UA: NEGATIVE
Glucose, UA: NEGATIVE
Ketones, UA: NEGATIVE
Leukocytes, UA: NEGATIVE
Nitrite, UA: NEGATIVE
Protein, UA: NEGATIVE
Spec Grav, UA: 1.025 (ref 1.010–1.025)
Urobilinogen, UA: 0.2 E.U./dL
pH, UA: 7 (ref 5.0–8.0)

## 2022-06-27 ENCOUNTER — Other Ambulatory Visit: Payer: Self-pay | Admitting: Internal Medicine

## 2022-06-27 MED ORDER — ZEPBOUND 2.5 MG/0.5ML ~~LOC~~ SOAJ: 2.5000 mg | SUBCUTANEOUS | 0 refills | Status: DC

## 2022-06-28 ENCOUNTER — Other Ambulatory Visit: Payer: Self-pay

## 2022-06-28 MED ORDER — ZEPBOUND 2.5 MG/0.5ML ~~LOC~~ SOAJ: 2.5000 mg | SUBCUTANEOUS | 0 refills | Status: DC

## 2022-09-26 ENCOUNTER — Inpatient Hospital Stay (HOSPITAL_COMMUNITY)
Admission: AD | Admit: 2022-09-26 | Discharge: 2022-09-26 | Disposition: A | Discharge status: Home / Self Care | Payer: 59 | Attending: Obstetrics and Gynecology | Admitting: Obstetrics and Gynecology

## 2022-09-26 ENCOUNTER — Inpatient Hospital Stay (HOSPITAL_COMMUNITY): Payer: 59

## 2022-09-26 ENCOUNTER — Encounter (HOSPITAL_COMMUNITY): Payer: Self-pay | Admitting: *Deleted

## 2022-09-26 DIAGNOSIS — Z3A01 Less than 8 weeks gestation of pregnancy: Secondary | ICD-10-CM

## 2022-09-26 DIAGNOSIS — O200 Threatened abortion: Secondary | ICD-10-CM | POA: Diagnosis not present

## 2022-09-26 HISTORY — DX: Urinary tract infection, site not specified: N39.0

## 2022-09-26 LAB — CBC
HCT: 40.1 % (ref 36.0–46.0)
Hemoglobin: 13.6 g/dL (ref 12.0–15.0)
MCH: 31 pg (ref 26.0–34.0)
MCHC: 33.9 g/dL (ref 30.0–36.0)
MCV: 91.3 fL (ref 80.0–100.0)
Platelets: 223 10*3/uL (ref 150–400)
RBC: 4.39 MIL/uL (ref 3.87–5.11)
RDW: 13.4 % (ref 11.5–15.5)
WBC: 7.8 10*3/uL (ref 4.0–10.5)
nRBC: 0 % (ref 0.0–0.2)

## 2022-09-26 LAB — COMPREHENSIVE METABOLIC PANEL
ALT: 32 U/L (ref 0–44)
AST: 25 U/L (ref 15–41)
Albumin: 3.5 g/dL (ref 3.5–5.0)
Alkaline Phosphatase: 39 U/L (ref 38–126)
Anion gap: 8 (ref 5–15)
BUN: 7 mg/dL (ref 6–20)
CO2: 22 mmol/L (ref 22–32)
Calcium: 9 mg/dL (ref 8.9–10.3)
Chloride: 106 mmol/L (ref 98–111)
Creatinine, Ser: 0.9 mg/dL (ref 0.44–1.00)
GFR, Estimated: >60 mL/min (ref >60)
Glucose, Bld: 117 mg/dL — ABNORMAL HIGH (ref 70–99)
Potassium: 3.9 mmol/L (ref 3.5–5.1)
Sodium: 136 mmol/L (ref 135–145)
Total Bilirubin: 0.7 mg/dL (ref 0.3–1.2)
Total Protein: 6.5 g/dL (ref 6.5–8.1)

## 2022-09-26 LAB — ABO/RH: ABO/RH(D): B POS

## 2022-09-26 LAB — WET PREP, GENITAL
Sperm: NONE SEEN
Trich, Wet Prep: NONE SEEN
WBC, Wet Prep HPF POC: <10 (ref <10)
Yeast Wet Prep HPF POC: NONE SEEN

## 2022-09-26 LAB — HCG, QUANTITATIVE, PREGNANCY: hCG, Beta Chain, Quant, S: 362 m[IU]/mL — ABNORMAL HIGH (ref <5)

## 2022-09-26 LAB — POCT PREGNANCY, URINE: Preg Test, Ur: POSITIVE — AB

## 2022-09-26 NOTE — [facility] Note (Signed)
Linda Richards is a 37 y.o. at [facility] here in [facility] reporting: cramping and bleeding started yesterday. Red  to pink, sees it when she wipes, one very small clot.  Mild cramping in lower abd. Has been seen in the office, has not had an [place] yet LMP: 5/4 Onset of complaint: yesterday Pain score: mild Vitals:   09/26/22 0915  BP: 115/66  Pulse: 78  Resp: 18  Temp: 98.2 F (36.8 C)  SpO2: 99%      Lab orders placed from triage:

## 2022-09-26 NOTE — MAU Provider Note (Signed)
History     CSN: [id number]  Arrival date and time: 09/26/22 0856   None     Chief Complaint  Patient presents with   Vaginal Bleeding   Abdominal Pain   HPI This is a 37 year old G2 P1-0-0-1 at 6 weeks and 2 days by LMP who presents with light vaginal bleeding and cramping that started yesterday.  The symptoms are intermittent.  She called her OB office, [facility], who sent her here for evaluation.  No abnormal discharge.  OB History     Gravida  2   Para  1   Term  1   Preterm      AB      Living  1      SAB      IAB      Ectopic      Multiple      Live Births  1           Past Medical History:  Diagnosis Date   Family history of adverse reaction to anesthesia    patient states mother had reaction with surgery but can not remember what   UTI (urinary tract infection)     Past Surgical History:  Procedure Laterality Date   BREAST REDUCTION SURGERY Bilateral 02/11/2020   Procedure: BILATERAL MAMMARY REDUCTION  (BREAST);  Surgeon: Contogiannis, Chales Abrahams, MD;  Location: [facility];  Service: Plastics;  Laterality: Bilateral;   WISDOM TOOTH EXTRACTION      Family History  Problem Relation Age of Onset   Kidney disease Mother    Diabetes Mother    Kidney disease Father    Cancer Father        prostate   Diabetes Father    Hypertension Father    Cancer Paternal Grandmother     Social History   Tobacco Use   Smoking status: Never   Smokeless tobacco: Never  Vaping Use   Vaping Use: Never used  Substance Use Topics   Alcohol use: Not Currently    Comment: social   Drug use: No    Allergies:  Allergies  Allergen Reactions   Penicillins Swelling    Medications Prior to Admission  Medication Sig Dispense Refill Last Dose   MELATONIN GUMMIES PO Take by mouth Nightly.   Past Month   Prenatal Vit-Fe Fumarate-FA (MULTIVITAMIN-PRENATAL) 27-0.8 MG TABS tablet Take 1 tablet by mouth daily at 12 noon.   09/26/2022   Multiple  Vitamin (MULTIVITAMIN) tablet Take 1 tablet by mouth.      tirzepatide (ZEPBOUND) 2.5 MG/0.5ML Pen Inject 2.5 mg into the skin once a week. 2 mL 0     Review of Systems Physical Exam   Blood pressure 115/66, pulse 78, temperature 98.2 F (36.8 C), temperature source Oral, resp. rate 18, height 5\' 2"  (1.575 m), weight 104.1 kg, last menstrual period 08/13/2022, SpO2 99 %.  Physical Exam Vitals and nursing note reviewed.  Constitutional:      Appearance: She is well-developed.  HENT:     Head: Normocephalic and atraumatic.  Abdominal:     General: Bowel sounds are normal.     Tenderness: There is abdominal tenderness (mild) in the right lower quadrant and left lower quadrant. There is no guarding or rebound.  Skin:    General: Skin is warm.  Neurological:     Mental Status: She is alert.    Results for orders placed or performed during the hospital encounter of 09/26/22 (from the past 24 hour(s))  CBC     Status: None   Collection Time: 09/26/22  9:21 AM  Result Value Ref Range   WBC 7.8 4.0 - 10.5 K/uL   RBC 4.39 3.87 - 5.11 MIL/uL   Hemoglobin 13.6 12.0 - 15.0 g/dL   HCT 16.1 09.6 - 04.5 %   MCV 91.3 80.0 - 100.0 fL   MCH 31.0 26.0 - 34.0 pg   MCHC 33.9 30.0 - 36.0 g/dL   RDW 40.9 81.1 - 91.4 %   Platelets 223 150 - 400 K/uL   nRBC 0.0 0.0 - 0.2 %  ABO/Rh     Status: None   Collection Time: 09/26/22  9:21 AM  Result Value Ref Range   ABO/RH(D) B POS    No rh immune globuloin      NOT A RH IMMUNE GLOBULIN CANDIDATE, PT RH POSITIVE Performed at [facility], [address], [place], [place] [address]   hCG, quantitative, pregnancy     Status: Abnormal   Collection Time: 09/26/22  9:21 AM  Result Value Ref Range   hCG, Beta Chain, Quant, S 362 (H) <5 mIU/mL  Comprehensive metabolic panel     Status: Abnormal   Collection Time: 09/26/22  9:21 AM  Result Value Ref Range   Sodium 136 135 - 145 mmol/L   Potassium 3.9 3.5 - 5.1 mmol/L   Chloride 106 98 - 111  mmol/L   CO2 22 22 - 32 mmol/L   Glucose, Bld 117 (H) 70 - 99 mg/dL   BUN 7 6 - 20 mg/dL   Creatinine, Ser 6.21 0.44 - 1.00 mg/dL   Calcium 9.0 8.9 - 30.8 mg/dL   Total Protein 6.5 6.5 - 8.1 g/dL   Albumin 3.5 3.5 - 5.0 g/dL   AST 25 15 - 41 U/L   ALT 32 0 - 44 U/L   Alkaline Phosphatase 39 38 - 126 U/L   Total Bilirubin 0.7 0.3 - 1.2 mg/dL   GFR, Estimated >65 >78 mL/min   Anion gap 8 5 - 15  Pregnancy, urine POC     Status: Abnormal   Collection Time: 09/26/22  9:34 AM  Result Value Ref Range   Preg Test, Ur POSITIVE (A) NEGATIVE  Wet prep, genital     Status: Abnormal   Collection Time: 09/26/22 10:56 AM  Result Value Ref Range   Yeast Wet Prep HPF POC NONE SEEN NONE SEEN   Trich, Wet Prep NONE SEEN NONE SEEN   Clue Cells Wet Prep HPF POC PRESENT (A) NONE SEEN   WBC, Wet Prep HPF POC <10 <10   Sperm NONE SEEN      MAU Course  Procedures  MDM Although clue cells present, no abnormal discharge.  Assessment and Plan   1. Threatened miscarriage in early pregnancy    F/u HCG in 48 hours.  Discussed with Dr Su Hilt, who will arrange f/u.  Levie Heritage 09/26/2022, 11:38 AM

## 2022-09-27 LAB — GC/CHLAMYDIA PROBE AMP (~~LOC~~) NOT AT ARMC
Chlamydia: NEGATIVE
Comment: NEGATIVE
Comment: NORMAL
Neisseria Gonorrhea: NEGATIVE

## 2022-09-28 ENCOUNTER — Inpatient Hospital Stay (HOSPITAL_COMMUNITY)
Admission: AD | Admit: 2022-09-28 | Discharge: 2022-09-28 | Disposition: A | Discharge status: Home / Self Care | Payer: 59 | Attending: Obstetrics and Gynecology | Admitting: Obstetrics and Gynecology

## 2022-09-28 DIAGNOSIS — O209 Hemorrhage in early pregnancy, unspecified: Secondary | ICD-10-CM | POA: Diagnosis present

## 2022-09-28 DIAGNOSIS — O034 Incomplete spontaneous abortion without complication: Secondary | ICD-10-CM | POA: Diagnosis not present

## 2022-09-28 DIAGNOSIS — Z3A01 Less than 8 weeks gestation of pregnancy: Secondary | ICD-10-CM

## 2022-09-28 LAB — HCG, QUANTITATIVE, PREGNANCY: hCG, Beta Chain, Quant, S: 153 m[IU]/mL — ABNORMAL HIGH (ref <5)

## 2022-09-28 MED ORDER — OXYCODONE-ACETAMINOPHEN 5-325 MG PO TABS: 1.0000 | ORAL_TABLET | Freq: Four times a day (QID) | ORAL | 0 refills | Status: DC | PRN

## 2022-09-28 MED ORDER — IBUPROFEN 600 MG PO TABS: 600.0000 mg | ORAL_TABLET | Freq: Four times a day (QID) | ORAL | 1 refills | Status: DC | PRN

## 2022-09-28 NOTE — MAU Provider Note (Signed)
Chief Complaint: Abdominal Pain and Vaginal Bleeding   Event Date/Time   First Provider Initiated Contact with Patient 09/28/22 2102        SUBJECTIVE HPI: Linda Richards is a 37 y.o. G2P1001 at [redacted]w[redacted]d by LMP who presents to maternity admissions reporting increased vaginal bleeding and cramping.  States also passed a small gelatinous rounded piece of tissue, which was clear and not bloody  Had followup HCG in office today but came in because bleeding got heavier.  Has an [place] scheduled for tomorrow. She denies vaginal bleeding, vaginal itching/burning, urinary symptoms, h/a, dizziness, n/v, or fever/chills.    Abdominal Pain This is a recurrent problem. The current episode started in the past 7 days. The pain is mild. The quality of the pain is cramping. Pertinent negatives include no constipation, diarrhea, dysuria, fever or frequency. Nothing aggravates the pain. The pain is relieved by Nothing.  Vaginal Bleeding This is a recurrent problem. The pain is mild. She is pregnant. Associated symptoms include abdominal pain. Pertinent negatives include no constipation, diarrhea, dysuria, fever or frequency. Nothing aggravates the symptoms. She has tried nothing for the symptoms.   RN Note: Linda Richards is a 37 y.o. at [redacted]w[redacted]d here in MAU reporting: VB and lower ABD cramping since Sunday but return to MAU today the pain increased in intensity like a period. Pt states the VB is increased some, bright red blood with wiping and streaks on her pad, and small clots in the toilet. Pt denies taking any pain medications. Pt denies recent intercourse. Pt states she was told to do a follow up HCG with her OB and she did today, results pending.   Onset of complaint: ongoing since Sunday  Pain score: 7/10  Past Medical History:  Diagnosis Date   Family history of adverse reaction to anesthesia    patient states mother had reaction with surgery but can not remember what   UTI (urinary tract infection)    Past  Surgical History:  Procedure Laterality Date   BREAST REDUCTION SURGERY Bilateral 02/11/2020   Procedure: BILATERAL MAMMARY REDUCTION  (BREAST);  Surgeon: Contogiannis, Chales Abrahams, MD;  Location: [facility];  Service: Plastics;  Laterality: Bilateral;   WISDOM TOOTH EXTRACTION     Social History   Socioeconomic History   Marital status: Single    Spouse name: Not on file   Number of children: Not on file   Years of education: Not on file   Highest education level: Not on file  Occupational History   Not on file  Tobacco Use   Smoking status: Never   Smokeless tobacco: Never  Vaping Use   Vaping Use: Never used  Substance and Sexual Activity   Alcohol use: Not Currently    Comment: social   Drug use: No   Sexual activity: Yes  Other Topics Concern   Not on file  Social History Narrative   Not on file   Social Determinants of Health   Financial Resource Strain: Not on file  Food Insecurity: Not on file  Transportation Needs: Not on file  Physical Activity: Not on file  Stress: Not on file  Social Connections: Not on file  Intimate Partner Violence: Not on file   No current facility-administered medications on file prior to encounter.   Current Outpatient Medications on File Prior to Encounter  Medication Sig Dispense Refill   MELATONIN GUMMIES PO Take by mouth Nightly.     Multiple Vitamin (MULTIVITAMIN) tablet Take 1 tablet by mouth.  Prenatal Vit-Fe Fumarate-FA (MULTIVITAMIN-PRENATAL) 27-0.8 MG TABS tablet Take 1 tablet by mouth daily at 12 noon.     Allergies  Allergen Reactions   Penicillins Swelling    I have reviewed patient's Past Medical Hx, Surgical Hx, Family Hx, Social Hx, medications and allergies.   ROS:  Review of Systems  Constitutional:  Negative for fever.  Gastrointestinal:  Positive for abdominal pain. Negative for constipation and diarrhea.  Genitourinary:  Positive for vaginal bleeding. Negative for dysuria and frequency.    Review of Systems  Other systems negative   Physical Exam  Physical Exam Patient Vitals for the past 24 hrs:  BP Temp Temp src Pulse Resp SpO2 Height Weight  09/28/22 1950 (!) 145/67 98.8 F (37.1 C) Oral 86 20 99 % 5\' 2"  (1.575 m) 103.4 kg   Constitutional: Well-developed, well-nourished female in no acute distress.  Cardiovascular: normal rate Respiratory: normal effort GI: Abd soft, non-tender.  MS: Extremities nontender, no edema, normal ROM Neurologic: Alert and oriented x 4.  GU: Neg CVAT.  PELVIC EXAM: deferred  LAB RESULTS Results for orders placed or performed during the hospital encounter of 09/28/22 (from the past 24 hour(s))  hCG, quantitative, pregnancy     Status: Abnormal   Collection Time: 09/28/22  7:57 PM  Result Value Ref Range   hCG, Beta Chain, Quant, S 153 (H) <5 mIU/mL   2 days ago:  362 High      --/--/B POS (06/17 1610)  IMAGING   MAU Management/MDM: I have reviewed the triage vital signs and the nursing notes.   Pertinent labs & imaging results that were available during my care of the patient were reviewed by me and considered in my medical decision making (see chart for details).      I have reviewed her medical records including past results, notes and treatments. Medical, Surgical, and family history were reviewed.  Medications and recent lab tests were reviewed  Ordered followup HCG level which has dropped. Discussed this indicated inevitable abortion Encouraged patient to call office in AM to discuss followup .  ASSESSMENT Pregnancy at [redacted]w[redacted]d by LMP Bleeding in early pregnancy Drop in HCG level over 2 days Inevitable abortion  PLAN Discharge home Followup in office tomorrow Bleeding precautions  Pt stable at time of discharge. Encouraged to return here if she develops worsening of symptoms, increase in pain, fever, or other concerning symptoms.    Wynelle Bourgeois CNM, MSN Certified Nurse-Midwife 09/28/2022  9:02  PM

## 2022-09-28 NOTE — MAU Note (Signed)
.  Linda Richards is a 37 y.o. at [redacted]w[redacted]d here in MAU reporting: VB and lower ABD cramping since Sunday but return to MAU today the pain increased in intensity like a period. Pt states the VB is increased some, bright red blood with wiping and streaks on her pad, and small clots in the toilet. Pt denies taking any pain medications. Pt denies recent intercourse. Pt states she was told to do a follow up HCG with her OB and she did today, results pending.   Onset of complaint: ongoing since Sunday  Pain score: 7/10 Vitals:   09/28/22 1950  BP: (!) 145/67  Pulse: 86  Resp: 20  Temp: 98.8 F (37.1 C)  SpO2: 99%      Lab orders placed from triage:

## 2022-10-09 IMAGING — US US THYROID
1 series · 14 of 25 positions shown · non-contrast
Comparison: None.

CLINICAL DATA: goiter

EXAM:
THYROID ULTRASOUND
TECHNIQUE: Ultrasound examination of the thyroid gland and adjacent soft
tissues was performed.

[Series 1: us thyroid · 0.07mm/px · 14 of 47 slices shown]
[im 1/47]
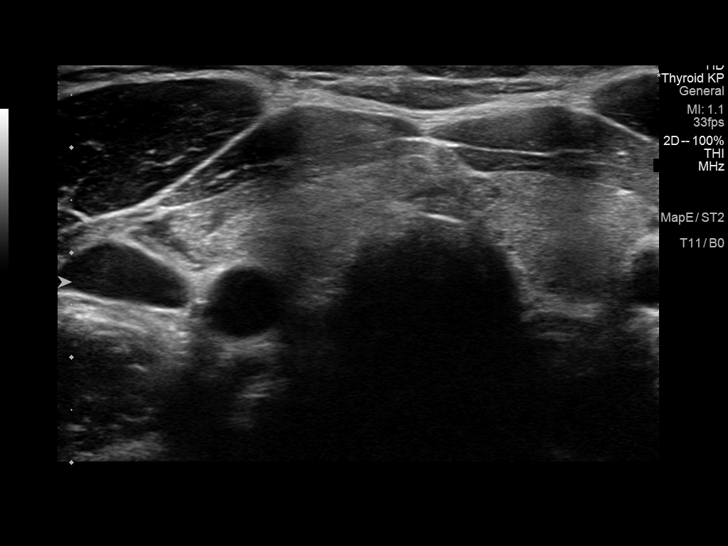
[im 4/47]
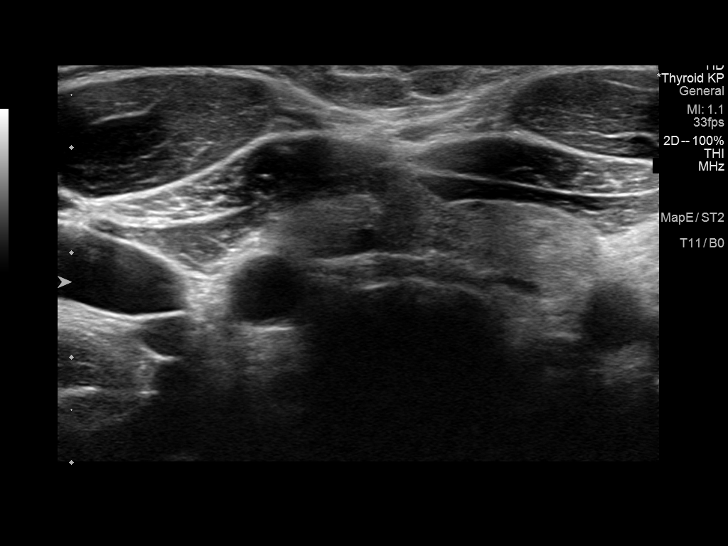
[im 8/47]
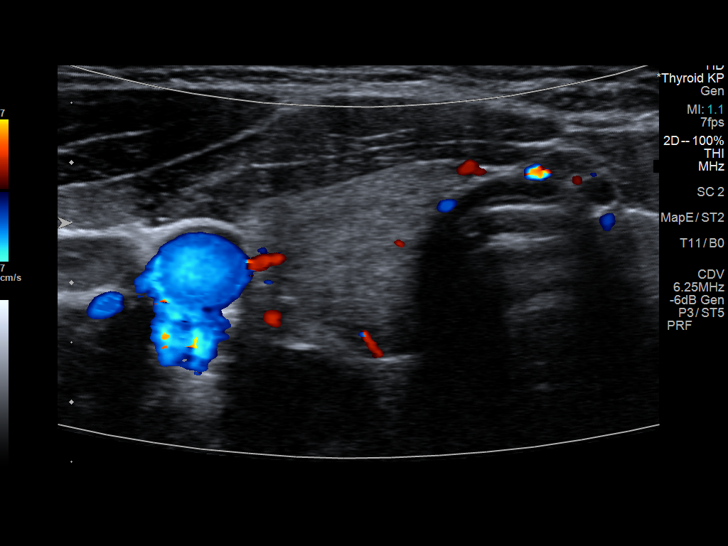
[im 12/47]
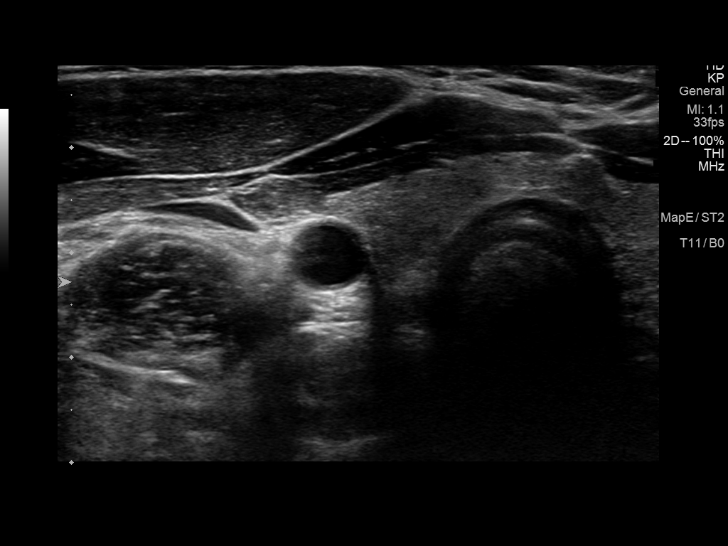
[im 16/47]
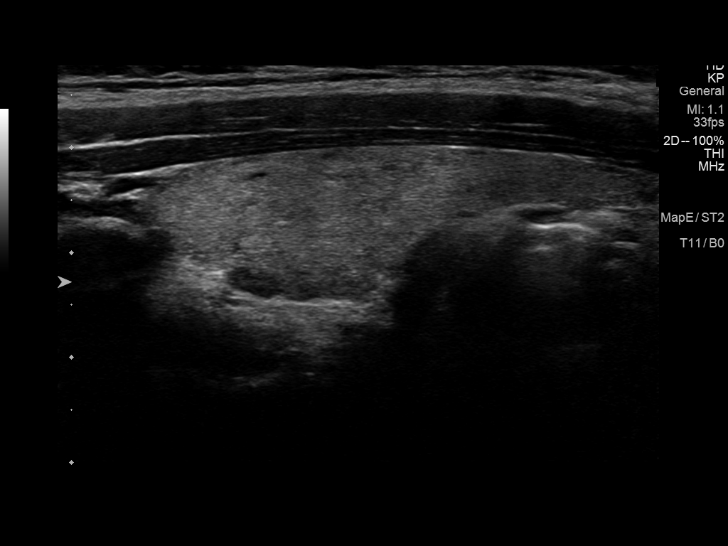
[im 18/47]
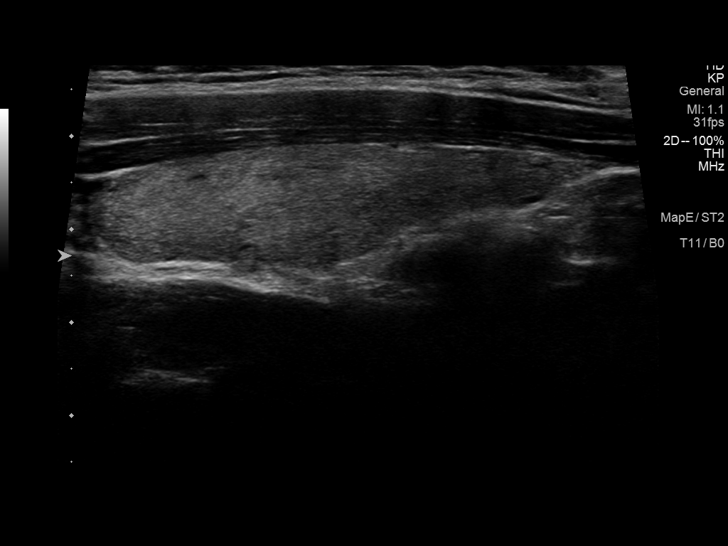
[im 22/47]
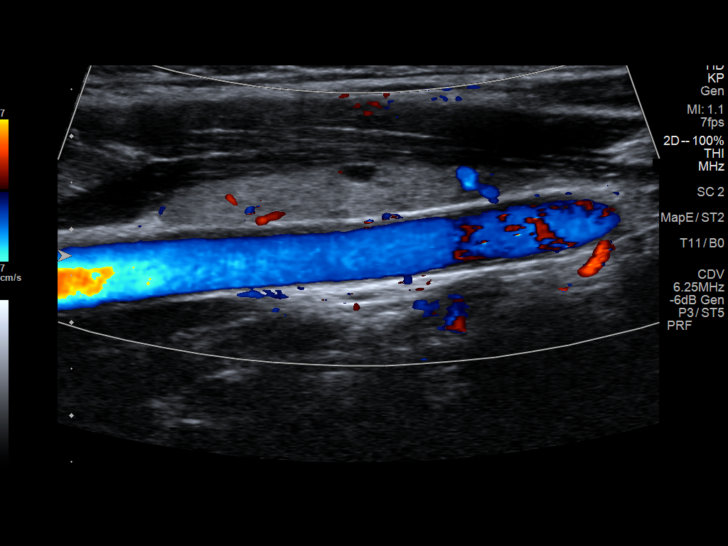
[im 25/47]
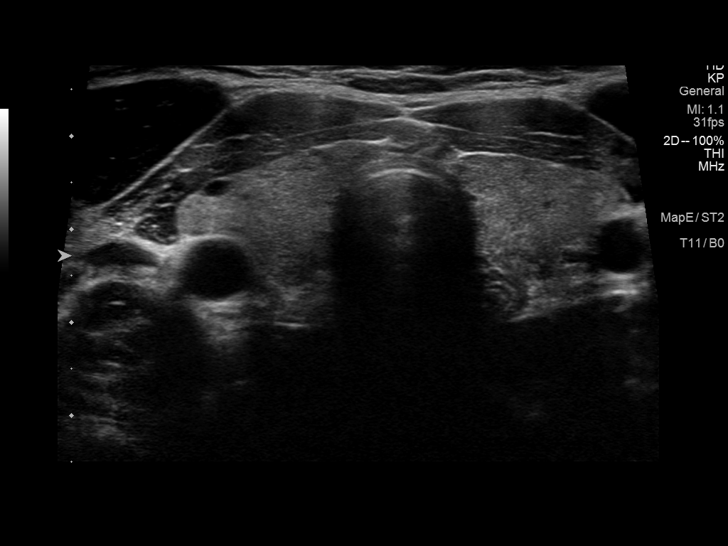
[im 29/47]
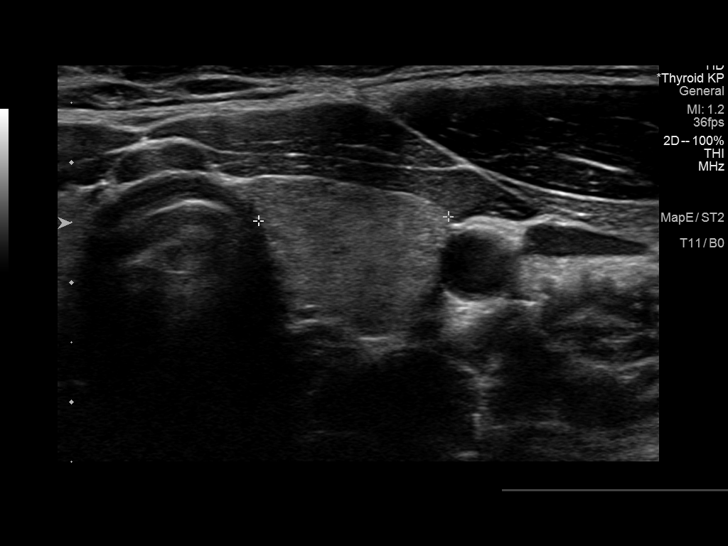
[im 31/47]
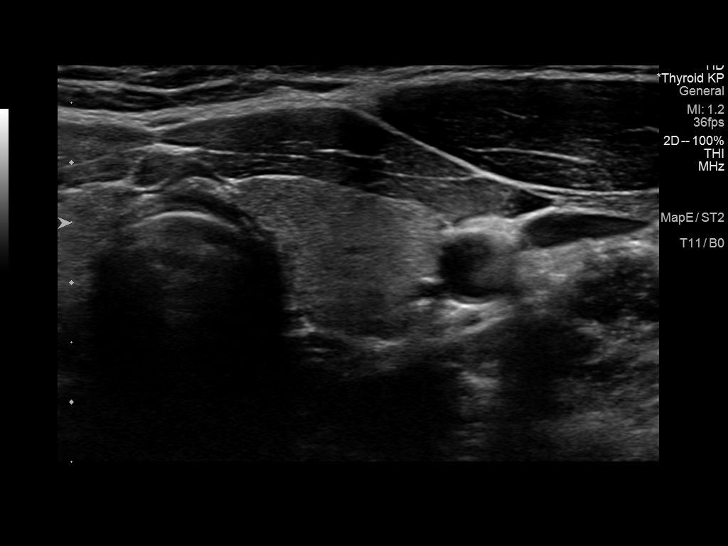
[im 35/47]
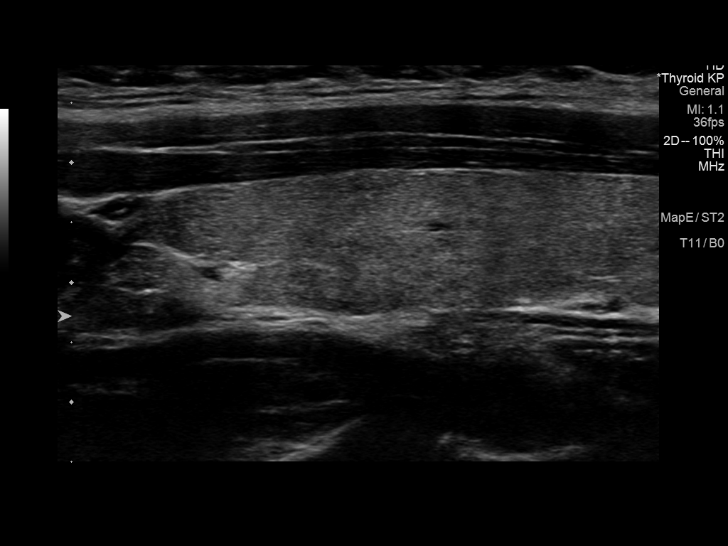
[im 39/47]
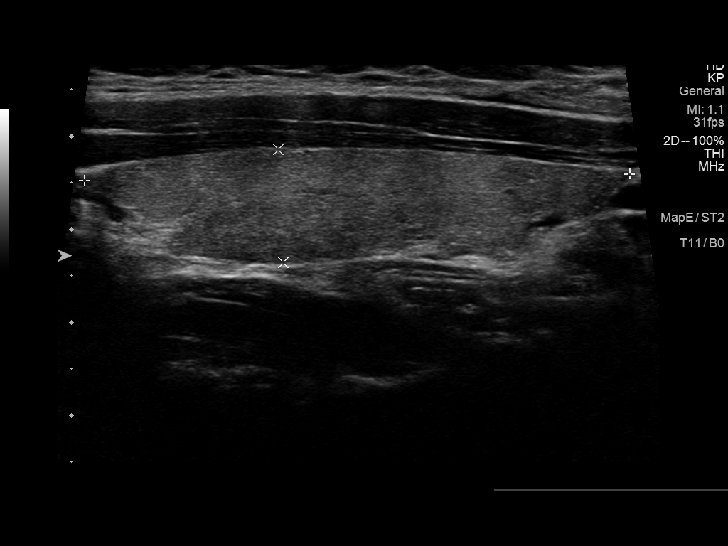
[im 43/47]
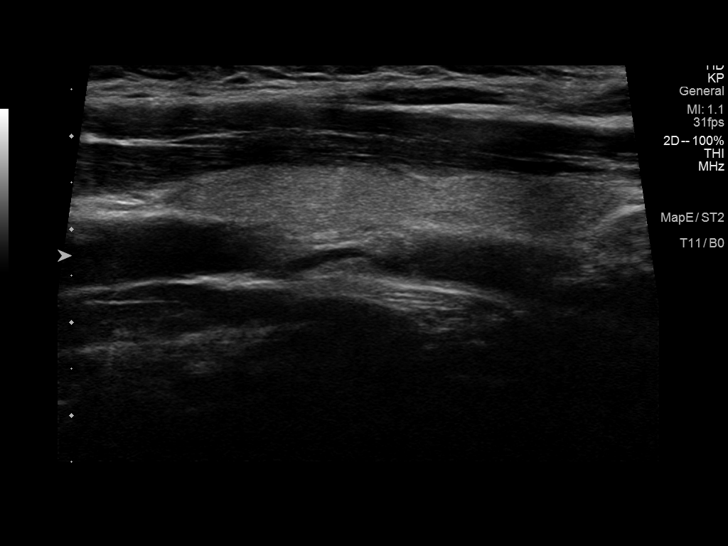
[im 47/47]
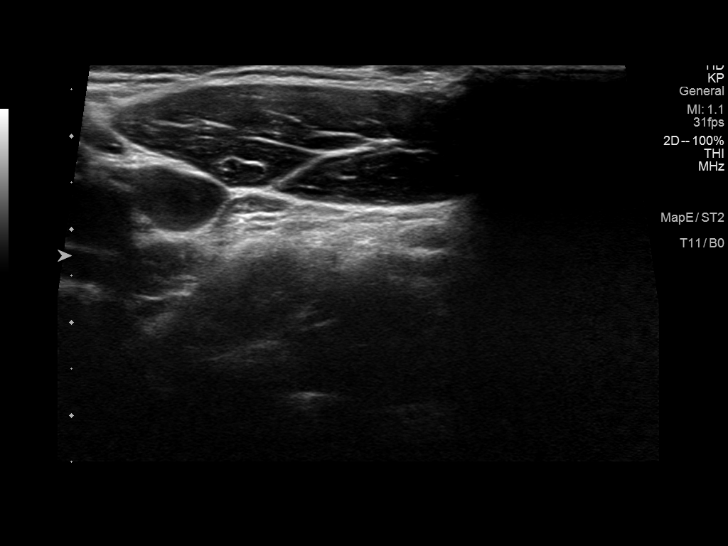

[14 of 25 positions shown; findings below may reference images not displayed]

FINDINGS: Parenchymal Echotexture: Mildly heterogenous

Isthmus: 0.6 cm

Right lobe: 5.5 x 1.3 x 1.6 cm

Left lobe: 5.9 x 1.2 x 1.6 cm

_________________________________________________________

Estimated total number of nodules >/= 1 cm: 0

Number of spongiform nodules >/=  2 cm not described below (TR1): 0

Number of mixed cystic and solid nodules >/= 1.5 cm not described
below (TR2): 0

_________________________________________________________

No discrete nodules are seen within the thyroid gland.
IMPRESSION: Borderline enlarged thyroid gland without discrete nodule
identified.

The above is in keeping with the ACR TI-RADS recommendations - [HOSPITAL] 7174;[DATE].

## 2022-10-10 ENCOUNTER — Ambulatory Visit (INDEPENDENT_AMBULATORY_CARE_PROVIDER_SITE_OTHER): Payer: 59 | Admitting: Internal Medicine

## 2022-10-10 ENCOUNTER — Encounter: Payer: Self-pay | Admitting: Internal Medicine

## 2022-10-10 VITALS — BP 110/60 | HR 66 | Temp 99.2°F | Ht 62.0 in | Wt 229.0 lb

## 2022-10-10 DIAGNOSIS — Z3009 Encounter for other general counseling and advice on contraception: Secondary | ICD-10-CM

## 2022-10-10 DIAGNOSIS — R7303 Prediabetes: Secondary | ICD-10-CM

## 2022-10-10 DIAGNOSIS — Z6841 Body Mass Index (BMI) 40.0 and over, adult: Secondary | ICD-10-CM

## 2022-10-10 MED ORDER — NORETHIN ACE-ETH ESTRAD-FE 1-20 MG-MCG PO TABS: 1.0000 | ORAL_TABLET | Freq: Every day | ORAL | 11 refills | Status: DC

## 2022-10-10 NOTE — Progress Notes (Signed)
Madelaine Bhat, CMA,acting as a scribe for Gwynneth Aliment, MD.,have documented all relevant documentation on the behalf of Gwynneth Aliment, MD,as directed by  Gwynneth Aliment, MD while in the presence of Gwynneth Aliment, MD.  Subjective:  Patient ID: Linda Richards , female    DOB: [birth year] , 37 y.o.   MRN: [id number]  Chief Complaint  Patient presents with   Prediabetes    HPI  Pt presents today for prediabetes follow up. She denies having any specific questions or concerns at this time. Patient denies any headaches, SOB, and chest pains. She has been exercising regularly.   BP Readings from Last 3 Encounters: 10/10/22 : 110/60 09/28/22 : 120/77 09/26/22 : 115/66       Past Medical History:  Diagnosis Date   Family history of adverse reaction to anesthesia    patient states mother had reaction with surgery but can not remember what   UTI (urinary tract infection)      Family History  Problem Relation Age of Onset   Kidney disease Mother    Diabetes Mother    Kidney disease Father    Cancer Father        prostate   Diabetes Father    Hypertension Father    Cancer Paternal Grandmother      Current Outpatient Medications:    MELATONIN GUMMIES PO, Take by mouth Nightly., Disp: , Rfl:    Multiple Vitamin (MULTIVITAMIN) tablet, Take 1 tablet by mouth., Disp: , Rfl:    norethindrone-ethinyl estradiol-FE (LOESTRIN FE 1/20) 1-20 MG-MCG tablet, Take 1 tablet by mouth daily., Disp: 28 tablet, Rfl: 11   ibuprofen (ADVIL) 600 MG tablet, Take 1 tablet (600 mg total) by mouth every 6 (six) hours as needed. (Patient not taking: Reported on 10/10/2022), Disp: 30 tablet, Rfl: 1   oxyCODONE-acetaminophen (PERCOCET/ROXICET) 5-325 MG tablet, Take 1-2 tablets by mouth every 6 (six) hours as needed for moderate pain. (Patient not taking: Reported on 10/10/2022), Disp: 10 tablet, Rfl: 0   Prenatal Vit-Fe Fumarate-FA (MULTIVITAMIN-PRENATAL) 27-0.8 MG TABS tablet, Take 1 tablet by mouth daily  at 12 noon. (Patient not taking: Reported on 10/10/2022), Disp: , Rfl:    Allergies  Allergen Reactions   Penicillins Swelling     Review of Systems  Constitutional: Negative.   Respiratory: Negative.    Cardiovascular: Negative.   Gastrointestinal: Negative.   Genitourinary:        She is interested in starting birth control. She has not inquired about this with her GYN. She would like to start oral contraceptives.   Neurological: Negative.   Psychiatric/Behavioral: Negative.       Today's Vitals   10/10/22 1500  BP: 110/60  Pulse: 66  Temp: 99.2 F (37.3 C)  TempSrc: Oral  Weight: 229 lb (103.9 kg)  Height: 5\' 2"  (1.575 m)  PainSc: 0-No pain   Body mass index is 41.88 kg/m.  Wt Readings from Last 3 Encounters:  10/10/22 229 lb (103.9 kg)  09/28/22 228 lb (103.4 kg)  09/26/22 229 lb 6.4 oz (104.1 kg)    The ASCVD Risk score (Arnett DK, et al., 2019) failed to calculate for the following reasons:   The 2019 ASCVD risk score is only valid for ages 35 to 16  Objective:  Physical Exam Vitals and nursing note reviewed.  Constitutional:      Appearance: Normal appearance.  HENT:     Head: Normocephalic and atraumatic.  Eyes:     Extraocular Movements: Extraocular  movements intact.  Cardiovascular:     Rate and Rhythm: Normal rate and regular rhythm.     Heart sounds: Normal heart sounds.  Pulmonary:     Effort: Pulmonary effort is normal.     Breath sounds: Normal breath sounds.  Musculoskeletal:     Cervical back: Normal range of motion.  Skin:    General: Skin is warm.  Neurological:     General: No focal deficit present.     Mental Status: She is alert.  Psychiatric:        Mood and Affect: Mood normal.        Behavior: Behavior normal.         Assessment And Plan:  Prediabetes Assessment & Plan: Previous labs reviewed, her A1c has been elevated in the past. I will check an A1c today. Reminded to avoid refined sugars including sugary drinks/foods and  processed meats including bacon, sausages and deli meats. She is due for physical in Sept 2024. I will defer labwork until this visit.    Counseling for birth control, oral contraceptives Assessment & Plan: We discussed use of Loestrin. I will send this to the pharmacy so she can see if this is covered by her insurance. She will also consult with her GYN. She also states she has upcoming GYN appt later this month. I also confirmed that she is a non-smoker.    Class 3 severe obesity due to excess calories with body mass index (BMI) of 40.0 to 44.9 in adult, unspecified whether serious comorbidity present [facility]) Assessment & Plan: BMI 41.  She is encouraged to aim for at least 150 minutes of exercise/week, while initially striving to lose ten percent of her body weight, or 22 lbs.    Other orders -     Norethin Ace-Eth Estrad-FE; Take 1 tablet by mouth daily.  Dispense: 28 tablet; Refill: 11    Return for controlled DM check 4 months.  Patient was given opportunity to ask questions. Patient verbalized understanding of the plan and was able to repeat key elements of the plan. All questions were answered to their satisfaction.    I, Gwynneth Aliment, MD, have reviewed all documentation for this visit. The documentation on 10/10/22 for the exam, diagnosis, procedures, and orders are all accurate and complete.   IF YOU HAVE BEEN REFERRED TO A SPECIALIST, IT MAY TAKE 1-2 WEEKS TO SCHEDULE/PROCESS THE REFERRAL. IF YOU HAVE NOT HEARD FROM US/SPECIALIST IN TWO WEEKS, PLEASE GIVE [place] A CALL AT [phone].

## 2022-10-23 DIAGNOSIS — R7303 Prediabetes: Secondary | ICD-10-CM | POA: Insufficient documentation

## 2022-10-23 DIAGNOSIS — Z3009 Encounter for other general counseling and advice on contraception: Secondary | ICD-10-CM | POA: Insufficient documentation

## 2022-10-23 DIAGNOSIS — E669 Obesity, unspecified: Secondary | ICD-10-CM | POA: Insufficient documentation

## 2022-10-23 NOTE — Assessment & Plan Note (Signed)
BMI 41.  She is encouraged to aim for at least 150 minutes of exercise/week, while initially striving to lose ten percent of her body weight, or 22 lbs.

## 2022-10-23 NOTE — Assessment & Plan Note (Addendum)
We discussed use of Loestrin. I will send this to the pharmacy so she can see if this is covered by her insurance. She will also consult with her GYN. She also states she has upcoming GYN appt later this month. I also confirmed that she is a non-smoker.

## 2022-10-23 NOTE — Assessment & Plan Note (Signed)
Previous labs reviewed, her A1c has been elevated in the past. I will check an A1c today. Reminded to avoid refined sugars including sugary drinks/foods and processed meats including bacon, sausages and deli meats. She is due for physical in Sept 2024. I will defer labwork until this visit.

## 2022-12-14 NOTE — Patient Instructions (Incomplete)

## 2022-12-15 ENCOUNTER — Encounter: Payer: Self-pay | Admitting: Internal Medicine

## 2022-12-15 ENCOUNTER — Ambulatory Visit (INDEPENDENT_AMBULATORY_CARE_PROVIDER_SITE_OTHER): Payer: 59 | Admitting: Internal Medicine

## 2022-12-15 VITALS — BP 118/68 | HR 74 | Temp 98.6°F | Ht 62.0 in | Wt 231.2 lb

## 2022-12-15 DIAGNOSIS — Z0000 Encounter for general adult medical examination without abnormal findings: Secondary | ICD-10-CM | POA: Diagnosis not present

## 2022-12-15 DIAGNOSIS — Z6841 Body Mass Index (BMI) 40.0 and over, adult: Secondary | ICD-10-CM | POA: Diagnosis not present

## 2022-12-15 DIAGNOSIS — R7303 Prediabetes: Secondary | ICD-10-CM

## 2022-12-15 NOTE — Progress Notes (Signed)
I,[name], CMA,acting as a Neurosurgeon for Linda Aliment, MD.,have documented all relevant documentation on the behalf of Linda Aliment, MD,as directed by  Linda Aliment, MD while in the presence of Linda Aliment, MD.  Subjective:    Patient ID: Linda Richards , female    DOB: 04-09-1986 , 37 y.o.   MRN: [id number]  Chief Complaint  Patient presents with   Annual Exam    HPI  The patient is here today for a physical examination. She is followed by Dr. Normand Sloop for her pap smears. She has no specific concerns or complaints at this time.          Past Medical History:  Diagnosis Date   Family history of adverse reaction to anesthesia    patient states mother had reaction with surgery but can not remember what   UTI (urinary tract infection)      Family History  Problem Relation Age of Onset   Kidney disease Mother    Diabetes Mother    Kidney disease Father    Cancer Father        prostate   Diabetes Father    Hypertension Father    Cancer Paternal Grandmother      Current Outpatient Medications:    MELATONIN GUMMIES PO, Take by mouth Nightly., Disp: , Rfl:    Multiple Vitamin (MULTIVITAMIN) tablet, Take 1 tablet by mouth., Disp: , Rfl:    norethindrone-ethinyl estradiol-FE (LOESTRIN FE 1/20) 1-20 MG-MCG tablet, Take 1 tablet by mouth daily., Disp: 28 tablet, Rfl: 11   ibuprofen (ADVIL) 600 MG tablet, Take 1 tablet (600 mg total) by mouth every 6 (six) hours as needed. (Patient not taking: Reported on 10/10/2022), Disp: 30 tablet, Rfl: 1   oxyCODONE-acetaminophen (PERCOCET/ROXICET) 5-325 MG tablet, Take 1-2 tablets by mouth every 6 (six) hours as needed for moderate pain. (Patient not taking: Reported on 10/10/2022), Disp: 10 tablet, Rfl: 0   Prenatal Vit-Fe Fumarate-FA (MULTIVITAMIN-PRENATAL) 27-0.8 MG TABS tablet, Take 1 tablet by mouth daily at 12 noon. (Patient not taking: Reported on 10/10/2022), Disp: , Rfl:    Allergies  Allergen Reactions   Penicillins  Swelling      The patient states she uses OCP (estrogen/progesterone) for birth control. Patient's last menstrual period was 11/25/2022.. Negative for Dysmenorrhea. Negative for: breast discharge, breast lump(s), breast pain and breast self exam. Associated symptoms include abnormal vaginal bleeding. Pertinent negatives include abnormal bleeding (hematology), anxiety, decreased libido, depression, difficulty falling sleep, dyspareunia, history of infertility, nocturia, sexual dysfunction, sleep disturbances, urinary incontinence, urinary urgency, vaginal discharge and vaginal itching. Diet regular.The patient states her exercise level is  moderate 3 days per week.   . The patient's tobacco use is:  Social History   Tobacco Use  Smoking Status Never  Smokeless Tobacco Never  . She has been exposed to passive smoke. The patient's alcohol use is:  Social History   Substance and Sexual Activity  Alcohol Use Not Currently   Comment: social    Review of Systems  Constitutional: Negative.   HENT: Negative.    Eyes: Negative.   Respiratory: Negative.    Cardiovascular: Negative.   Gastrointestinal: Negative.   Endocrine: Negative.   Genitourinary: Negative.   Musculoskeletal: Negative.   Skin: Negative.   Allergic/Immunologic: Negative.   Neurological: Negative.   Hematological: Negative.   Psychiatric/Behavioral: Negative.       Today's Vitals   12/15/22 1429  BP: 118/68  Pulse: 74  Temp: 98.6 F (37  C)  SpO2: 98%  Weight: 231 lb 3.2 oz (104.9 kg)  Height: 5\' 2"  (1.575 m)   Body mass index is 42.29 kg/m.  Wt Readings from Last 3 Encounters:  12/15/22 231 lb 3.2 oz (104.9 kg)  10/10/22 229 lb (103.9 kg)  09/28/22 228 lb (103.4 kg)     Objective:  Physical Exam Vitals and nursing note reviewed.  Constitutional:      Appearance: Normal appearance. She is obese.  HENT:     Head: Normocephalic and atraumatic.     Right Ear: Tympanic membrane, ear canal and external  ear normal.     Left Ear: Tympanic membrane, ear canal and external ear normal.     Nose: Nose normal.     Mouth/Throat:     Mouth: Mucous membranes are moist.     Pharynx: Oropharynx is clear.  Eyes:     Extraocular Movements: Extraocular movements intact.     Conjunctiva/sclera: Conjunctivae normal.     Pupils: Pupils are equal, round, and reactive to light.  Cardiovascular:     Rate and Rhythm: Normal rate and regular rhythm.     Pulses: Normal pulses.     Heart sounds: Normal heart sounds.  Pulmonary:     Effort: Pulmonary effort is normal.     Breath sounds: Normal breath sounds.  Chest:  Breasts:    Tanner Score is 5.     Right: Normal.     Left: Normal.     Comments: Well healed surgical scars b/l Abdominal:     General: Bowel sounds are normal.     Palpations: Abdomen is soft.     Comments: Rounded, soft  Genitourinary:    Comments: deferred Musculoskeletal:        General: Normal range of motion.     Cervical back: Normal range of motion and neck supple.  Skin:    General: Skin is warm and dry.  Neurological:     General: No focal deficit present.     Mental Status: She is alert and oriented to person, place, and time.  Psychiatric:        Mood and Affect: Mood normal.        Behavior: Behavior normal.         Assessment And Plan:     Annual physical exam Assessment & Plan: A full exam was performed.  Importance of monthly self breast exams was discussed with the patient.  She is advised to get 30-45 minutes of regular exercise, no less than four to five days per week. Both weight-bearing and aerobic exercises are recommended.  She is advised to follow a healthy diet with at least six fruits/veggies per day, decrease intake of red meat and other saturated fats and to increase fish intake to twice weekly.  Meats/fish should not be fried -- baked, boiled or broiled is preferable. It is also important to cut back on your sugar intake.  Be sure to read labels -  try to avoid anything with added sugar, high fructose corn syrup or other sweeteners.  If you must use a sweetener, you can try stevia or monkfruit.  It is also important to avoid artificially sweetened foods/beverages and diet drinks. Lastly, wear SPF 50 sunscreen on exposed skin and when in direct sunlight for an extended period of time.  Be sure to avoid fast food restaurants and aim for at least 60 ounces of water daily.      Orders: -     CMP14+EGFR -  Lipid panel -     Hemoglobin A1c -     HIV Antibody (routine testing w rflx) -     TSH  Class 3 severe obesity due to excess calories with body mass index (BMI) of 40.0 to 44.9 in adult, unspecified whether serious comorbidity present [facility]) Assessment & Plan: BMI 42.  She is encouraged to initially strive for BMI less than 35 to decrease cardiac risk. Advised to aim for at least 150 minutes of exercise per week.       She is encouraged to strive for BMI less than 30 to decrease cardiac risk. Advised to aim for at least 150 minutes of exercise per week.    Return for 1 Year HM, 4 Month PREDM f/u. Patient was given opportunity to ask questions. Patient verbalized understanding of the plan and was able to repeat key elements of the plan. All questions were answered to their satisfaction.   I, Linda Aliment, MD, have reviewed all documentation for this visit. The documentation on 12/15/22 for the exam, diagnosis, procedures, and orders are all accurate and complete.

## 2022-12-16 LAB — TSH: TSH: 1.23 u[IU]/mL (ref 0.450–4.500)

## 2022-12-16 LAB — CMP14+EGFR
ALT: 16 IU/L (ref 0–32)
AST: 17 IU/L (ref 0–40)
Albumin: 4 g/dL (ref 3.9–4.9)
Alkaline Phosphatase: 39 IU/L — ABNORMAL LOW (ref 44–121)
BUN/Creatinine Ratio: 8 — ABNORMAL LOW (ref 9–23)
BUN: 7 mg/dL (ref 6–20)
Bilirubin Total: <0.2 mg/dL (ref 0.0–1.2)
CO2: 23 mmol/L (ref 20–29)
Calcium: 9.2 mg/dL (ref 8.7–10.2)
Chloride: 104 mmol/L (ref 96–106)
Creatinine, Ser: 0.84 mg/dL (ref 0.57–1.00)
Globulin, Total: 2.6 g/dL (ref 1.5–4.5)
Glucose: 121 mg/dL — ABNORMAL HIGH (ref 70–99)
Potassium: 4.4 mmol/L (ref 3.5–5.2)
Sodium: 138 mmol/L (ref 134–144)
Total Protein: 6.6 g/dL (ref 6.0–8.5)
eGFR: 92 mL/min/{1.73_m2} (ref >59)

## 2022-12-16 LAB — HIV ANTIBODY (ROUTINE TESTING W REFLEX): HIV Screen 4th Generation wRfx: NONREACTIVE

## 2022-12-16 LAB — HEMOGLOBIN A1C
Est. average glucose Bld gHb Est-mCnc: 123 mg/dL
Hgb A1c MFr Bld: 5.9 % — ABNORMAL HIGH (ref 4.8–5.6)

## 2022-12-16 LAB — LIPID PANEL
Chol/HDL Ratio: 3.1 ratio (ref 0.0–4.4)
Cholesterol, Total: 168 mg/dL (ref 100–199)
HDL: 54 mg/dL (ref >39)
LDL Chol Calc (NIH): 92 mg/dL (ref 0–99)
Triglycerides: 122 mg/dL (ref 0–149)
VLDL Cholesterol Cal: 22 mg/dL (ref 5–40)

## 2022-12-18 DIAGNOSIS — Z0000 Encounter for general adult medical examination without abnormal findings: Secondary | ICD-10-CM | POA: Insufficient documentation

## 2022-12-18 NOTE — Assessment & Plan Note (Signed)
BMI 42. She is encouraged to initially strive for BMI less than 35 to decrease cardiac risk. Advised to aim for at least 150 minutes of exercise per week.

## 2022-12-18 NOTE — Assessment & Plan Note (Addendum)

## 2023-02-13 ENCOUNTER — Ambulatory Visit: Payer: 59 | Admitting: Internal Medicine

## 2023-04-19 ENCOUNTER — Encounter: Payer: Self-pay | Admitting: Internal Medicine

## 2023-04-19 ENCOUNTER — Ambulatory Visit (INDEPENDENT_AMBULATORY_CARE_PROVIDER_SITE_OTHER): Payer: 59 | Admitting: Internal Medicine

## 2023-04-19 VITALS — BP 120/78 | HR 83 | Temp 98.3°F | Ht 62.0 in | Wt 239.4 lb

## 2023-04-19 DIAGNOSIS — R635 Abnormal weight gain: Secondary | ICD-10-CM | POA: Diagnosis not present

## 2023-04-19 DIAGNOSIS — R7303 Prediabetes: Secondary | ICD-10-CM

## 2023-04-19 NOTE — Progress Notes (Addendum)
 I,Linda Richards, CMA,acting as a neurosurgeon for Linda LOISE Slocumb, MD.,have documented all relevant documentation on the behalf of Linda LOISE Slocumb, MD,as directed by  Linda LOISE Slocumb, MD while in the presence of Linda LOISE Slocumb, MD.  Subjective:  Patient ID: Linda Richards , female    DOB: 1985/09/21 , 38 y.o.   MRN: [id number]  No chief complaint on file.   HPI  Pt presents today for prediabetes follow up. She denies having any specific questions or concerns at this time. Patient denies having any headaches, SOB, and chest pains. She has been exercising regularly.         Past Medical History:  Diagnosis Date   Family history of adverse reaction to anesthesia    patient states mother had reaction with surgery but can not remember what   UTI (urinary tract infection)      Family History  Problem Relation Age of Onset   Kidney disease Mother    Diabetes Mother    Kidney disease Father    Cancer Father        prostate   Diabetes Father    Hypertension Father    Cancer Paternal Grandmother      Current Outpatient Medications:    ibuprofen  (ADVIL ) 600 MG tablet, Take 1 tablet (600 mg total) by mouth every 6 (six) hours as needed. (Patient not taking: Reported on 04/19/2023), Disp: 30 tablet, Rfl: 1   MELATONIN GUMMIES PO, Take by mouth Nightly. (Patient not taking: Reported on 04/19/2023), Disp: , Rfl:    Multiple Vitamin (MULTIVITAMIN) tablet, Take 1 tablet by mouth. (Patient not taking: Reported on 04/19/2023), Disp: , Rfl:    Prenatal Vit-Fe Fumarate-FA (MULTIVITAMIN-PRENATAL) 27-0.8 MG TABS tablet, Take 1 tablet by mouth daily at 12 noon. (Patient not taking: Reported on 04/19/2023), Disp: , Rfl:    Allergies  Allergen Reactions   Penicillins Swelling     Review of Systems  Constitutional: Negative.   Respiratory: Negative.    Cardiovascular: Negative.   Gastrointestinal: Negative.   Neurological: Negative.   Psychiatric/Behavioral: Negative.       Today's Vitals    04/19/23 1613  BP: 120/78  Pulse: 83  Temp: 98.3 F (36.8 C)  SpO2: 98%  Weight: 239 lb 6.4 oz (108.6 kg)  Height: 5' 2 (1.575 m)   Body mass index is 43.79 kg/m.  Wt Readings from Last 3 Encounters:  04/19/23 239 lb 6.4 oz (108.6 kg)  12/15/22 231 lb 3.2 oz (104.9 kg)  10/10/22 229 lb (103.9 kg)     Objective:  Physical Exam Vitals and nursing note reviewed.  Constitutional:      Appearance: Normal appearance. She is obese.  HENT:     Head: Normocephalic and atraumatic.  Eyes:     Extraocular Movements: Extraocular movements intact.  Cardiovascular:     Rate and Rhythm: Normal rate and regular rhythm.     Heart sounds: Normal heart sounds.  Pulmonary:     Effort: Pulmonary effort is normal.     Breath sounds: Normal breath sounds.  Musculoskeletal:     Cervical back: Normal range of motion.  Skin:    General: Skin is warm.  Neurological:     General: No focal deficit present.     Mental Status: She is alert.  Psychiatric:        Mood and Affect: Mood normal.        Behavior: Behavior normal.         Assessment And Plan:  Prediabetes Assessment & Plan: Previous labs reviewed, her A1c has been elevated in the past. I will check an A1c today. Reminded to avoid refined sugars including sugary drinks/foods and processed meats including bacon, sausages and deli meats.   Orders: -     Hemoglobin A1c -     BMP8+EGFR  Weight gain Assessment & Plan: She has gained 8lbs since September 2024.  She declines MWM referral. She is encouraged to incorporate strength training into her exercise routine, at least 2-3 times per week.   Orders: -     Insulin , random  She is encouraged to strive for BMI less than 30 to decrease cardiac risk. Advised to aim for at least 150 minutes of exercise per week.    Return for 4 MONTH PRE DM F/U. SABRA  Patient was given opportunity to ask questions. Patient verbalized understanding of the plan and was able to repeat key elements of the  plan. All questions were answered to their satisfaction.    I, Linda LOISE Slocumb, MD, have reviewed all documentation for this visit. The documentation on 04/19/23 for the exam, diagnosis, procedures, and orders are all accurate and complete.    IF YOU HAVE BEEN REFERRED TO A SPECIALIST, IT MAY TAKE 1-2 WEEKS TO SCHEDULE/PROCESS THE REFERRAL. IF YOU HAVE NOT HEARD FROM US /SPECIALIST IN TWO WEEKS, PLEASE GIVE US  A CALL AT [phone].   THE PATIENT IS ENCOURAGED TO PRACTICE SOCIAL DISTANCING DUE TO THE COVID-19 PANDEMIC.

## 2023-04-19 NOTE — Patient Instructions (Signed)

## 2023-04-20 LAB — BMP8+EGFR
BUN/Creatinine Ratio: 12 (ref 9–23)
BUN: 11 mg/dL (ref 6–20)
CO2: 23 mmol/L (ref 20–29)
Calcium: 9.4 mg/dL (ref 8.7–10.2)
Chloride: 103 mmol/L (ref 96–106)
Creatinine, Ser: 0.9 mg/dL (ref 0.57–1.00)
Glucose: 119 mg/dL — ABNORMAL HIGH (ref 70–99)
Potassium: 4.3 mmol/L (ref 3.5–5.2)
Sodium: 140 mmol/L (ref 134–144)
eGFR: 85 mL/min/{1.73_m2} (ref >59)

## 2023-04-20 LAB — HEMOGLOBIN A1C
Est. average glucose Bld gHb Est-mCnc: 128 mg/dL
Hgb A1c MFr Bld: 6.1 % — ABNORMAL HIGH (ref 4.8–5.6)

## 2023-04-20 LAB — INSULIN, RANDOM: INSULIN: 98.1 u[IU]/mL — ABNORMAL HIGH (ref 2.6–24.9)

## 2023-04-22 ENCOUNTER — Encounter: Payer: Self-pay | Admitting: Internal Medicine

## 2023-04-22 DIAGNOSIS — R635 Abnormal weight gain: Secondary | ICD-10-CM | POA: Insufficient documentation

## 2023-04-22 NOTE — Assessment & Plan Note (Addendum)
 Previous labs reviewed, her A1c has been elevated in the past. I will check an A1c today. Reminded to avoid refined sugars including sugary drinks/foods and processed meats including bacon, sausages and deli meats.

## 2023-04-22 NOTE — Assessment & Plan Note (Signed)
 She has gained 8lbs since September 2024.  She declines MWM referral. She is encouraged to incorporate strength training into her exercise routine, at least 2-3 times per week.

## 2023-04-24 ENCOUNTER — Encounter: Payer: Self-pay | Admitting: Internal Medicine

## 2023-04-26 ENCOUNTER — Other Ambulatory Visit: Payer: Self-pay | Admitting: Internal Medicine

## 2023-04-26 MED ORDER — METFORMIN HCL ER (MOD) 500 MG PO TB24: 500.0000 mg | ORAL_TABLET | Freq: Every day | ORAL | 1 refills | Status: DC

## 2023-05-30 ENCOUNTER — Encounter: Payer: Self-pay | Admitting: Internal Medicine

## 2023-05-30 ENCOUNTER — Ambulatory Visit (INDEPENDENT_AMBULATORY_CARE_PROVIDER_SITE_OTHER): Payer: 59 | Admitting: Internal Medicine

## 2023-05-30 VITALS — BP 120/82 | HR 74 | Temp 98.3°F | Ht 62.0 in | Wt 239.0 lb

## 2023-05-30 DIAGNOSIS — E66813 Obesity, class 3: Secondary | ICD-10-CM | POA: Diagnosis not present

## 2023-05-30 DIAGNOSIS — Z2821 Immunization not carried out because of patient refusal: Secondary | ICD-10-CM

## 2023-05-30 DIAGNOSIS — R7303 Prediabetes: Secondary | ICD-10-CM

## 2023-05-30 DIAGNOSIS — Z6841 Body Mass Index (BMI) 40.0 and over, adult: Secondary | ICD-10-CM | POA: Diagnosis not present

## 2023-05-30 NOTE — Progress Notes (Unsigned)
 I,Jameka J Llittleton, CMA,acting as a Neurosurgeon for Gwynneth Aliment, MD.,have documented all relevant documentation on the behalf of Gwynneth Aliment, MD,as directed by  Gwynneth Aliment, MD while in the presence of Gwynneth Aliment, MD.  Subjective:  Patient ID: Linda Richards , female    DOB: January 08, 1986 , 38 y.o.   MRN: [id number]  Chief Complaint  Patient presents with   Prediabetes    HPI  Pt presents today for prediabetes follow up. She denies having any specific questions or concerns at this time. Patient denies having any headaches, SOB, and chest pains. She has been taking metformin once daily without any issues.          Past Medical History:  Diagnosis Date   Family history of adverse reaction to anesthesia    patient states mother had reaction with surgery but can not remember what   UTI (urinary tract infection)      Family History  Problem Relation Age of Onset   Kidney disease Mother    Diabetes Mother    Kidney disease Father    Cancer Father        prostate   Diabetes Father    Hypertension Father    Cancer Paternal Grandmother      Current Outpatient Medications:    metFORMIN (GLUMETZA) 500 MG (MOD) 24 hr tablet, Take 1 tablet (500 mg total) by mouth daily with breakfast., Disp: 90 tablet, Rfl: 1   Allergies  Allergen Reactions   Penicillins Swelling     Review of Systems  Constitutional: Negative.   Eyes: Negative.   Respiratory: Negative.    Cardiovascular: Negative.   Gastrointestinal: Negative.   Endocrine: Negative for polydipsia, polyphagia and polyuria.  Musculoskeletal: Negative.   Skin: Negative.   Psychiatric/Behavioral: Negative.       Today's Vitals   05/30/23 1631  BP: 120/82  Pulse: 74  Temp: 98.3 F (36.8 C)  TempSrc: Oral  Weight: 239 lb (108.4 kg)  Height: 5\' 2"  (1.575 m)  PainSc: 0-No pain   Body mass index is 43.71 kg/m.  Wt Readings from Last 3 Encounters:  05/30/23 239 lb (108.4 kg)  04/19/23 239 lb 6.4 oz (108.6  kg)  12/15/22 231 lb 3.2 oz (104.9 kg)    The ASCVD Risk score (Arnett DK, et al., 2019) failed to calculate for the following reasons:   The 2019 ASCVD risk score is only valid for ages 8 to 2  Objective:  Physical Exam Vitals and nursing note reviewed.  Constitutional:      Appearance: Normal appearance. She is obese.  HENT:     Head: Normocephalic and atraumatic.  Eyes:     Extraocular Movements: Extraocular movements intact.  Cardiovascular:     Rate and Rhythm: Normal rate and regular rhythm.     Heart sounds: Normal heart sounds.  Pulmonary:     Effort: Pulmonary effort is normal.     Breath sounds: Normal breath sounds.  Musculoskeletal:     Cervical back: Normal range of motion.  Skin:    General: Skin is warm.  Neurological:     General: No focal deficit present.     Mental Status: She is alert.  Psychiatric:        Mood and Affect: Mood normal.        Behavior: Behavior normal.         Assessment And Plan:  Prediabetes Assessment & Plan: Previous labs reviewed, her A1c has been elevated in the  past. She is now taking metformin once daily.  I will check BMP today.  I planned to increase to twice daily dosing.  She is reminded to avoid sugary foods/drinks and processed meats including bacon, sausages and deli meats. She will rto in 3-4 months for re-evaluation.   Orders: -     BMP8+EGFR  Class 3 severe obesity due to excess calories with body mass index (BMI) of 40.0 to 44.9 in adult, unspecified whether serious comorbidity present (HCC) Assessment & Plan: BMI 43.  She is encouraged to initially strive for BMI less than 35 to decrease cardiac risk. Advised to aim for at least 150 minutes of exercise per week.       COVID-19 vaccination declined  Other orders -     metFORMIN HCl ER (MOD); Take 1 tablet (500 mg total) by mouth daily with breakfast.  Dispense: 90 tablet; Refill: 1    Return if symptoms worsen or fail to improve.  Patient was given  opportunity to ask questions. Patient verbalized understanding of the plan and was able to repeat key elements of the plan. All questions were answered to their satisfaction.    I, Gwynneth Aliment, MD, have reviewed all documentation for this visit. The documentation on 05/30/23 for the exam, diagnosis, procedures, and orders are all accurate and complete.   IF YOU HAVE BEEN REFERRED TO A SPECIALIST, IT MAY TAKE 1-2 WEEKS TO SCHEDULE/PROCESS THE REFERRAL. IF YOU HAVE NOT HEARD FROM US/SPECIALIST IN TWO WEEKS, PLEASE GIVE [place] A CALL AT [phone] X [phone].

## 2023-05-31 ENCOUNTER — Ambulatory Visit: Payer: 59 | Admitting: Internal Medicine

## 2023-05-31 ENCOUNTER — Encounter: Payer: Self-pay | Admitting: Internal Medicine

## 2023-05-31 LAB — BMP8+EGFR
BUN/Creatinine Ratio: 11 (ref 9–23)
BUN: 9 mg/dL (ref 6–20)
CO2: 21 mmol/L (ref 20–29)
Calcium: 9.7 mg/dL (ref 8.7–10.2)
Chloride: 104 mmol/L (ref 96–106)
Creatinine, Ser: 0.8 mg/dL (ref 0.57–1.00)
Glucose: 107 mg/dL — ABNORMAL HIGH (ref 70–99)
Potassium: 4 mmol/L (ref 3.5–5.2)
Sodium: 140 mmol/L (ref 134–144)
eGFR: 98 mL/min/{1.73_m2} (ref >59)

## 2023-05-31 MED ORDER — METFORMIN HCL ER (MOD) 500 MG PO TB24: 500.0000 mg | ORAL_TABLET | Freq: Every day | ORAL | 1 refills | Status: DC

## 2023-05-31 NOTE — Patient Instructions (Signed)

## 2023-05-31 NOTE — Assessment & Plan Note (Addendum)
 Previous labs reviewed, her A1c has been elevated in the past. She is now taking metformin once daily.  I will check BMP today.  I planned to increase to twice daily dosing.  She is reminded to avoid sugary foods/drinks and processed meats including bacon, sausages and deli meats. She will rto in 3-4 months for re-evaluation.

## 2023-05-31 NOTE — Assessment & Plan Note (Signed)
 BMI 43.  She is encouraged to initially strive for BMI less than 35 to decrease cardiac risk. Advised to aim for at least 150 minutes of exercise per week.

## 2023-06-26 ENCOUNTER — Other Ambulatory Visit: Payer: Self-pay | Admitting: Internal Medicine

## 2023-08-17 ENCOUNTER — Ambulatory Visit: Payer: 59 | Admitting: Internal Medicine

## 2023-08-17 NOTE — Progress Notes (Deleted)
 I,Keenen Roessner T Basil Lim, CMA,acting as a Neurosurgeon for Smiley Dung, MD.,have documented all relevant documentation on the behalf of Smiley Dung, MD,as directed by  Smiley Dung, MD while in the presence of Smiley Dung, MD.  Subjective:  Patient ID: Linda Richards , female    DOB: [birth year] , 38 y.o.   MRN: [id number]  No chief complaint on file.   HPI  HPI   Past Medical History:  Diagnosis Date   Family history of adverse reaction to anesthesia    patient states mother had reaction with surgery but can not remember what   UTI (urinary tract infection)      Family History  Problem Relation Age of Onset   Kidney disease Mother    Diabetes Mother    Kidney disease Father    Cancer Father        prostate   Diabetes Father    Hypertension Father    Cancer Paternal Grandmother      Current Outpatient Medications:    metFORMIN  (GLUMETZA ) 500 MG (MOD) 24 hr tablet, TAKE 1 TABLET(500 MG) BY MOUTH DAILY WITH BREAKFAST, Disp: 90 tablet, Rfl: 2   Allergies  Allergen Reactions   Penicillins Swelling     Review of Systems  Constitutional: Negative.   Respiratory: Negative.    Cardiovascular: Negative.   Neurological: Negative.   Psychiatric/Behavioral: Negative.       There were no vitals filed for this visit. There is no height or weight on file to calculate BMI.  Wt Readings from Last 3 Encounters:  05/30/23 239 lb (108.4 kg)  04/19/23 239 lb 6.4 oz (108.6 kg)  12/15/22 231 lb 3.2 oz (104.9 kg)     Objective:  Physical Exam      Assessment And Plan:  There are no diagnoses linked to this encounter.   No follow-ups on file.  Patient was given opportunity to ask questions. Patient verbalized understanding of the plan and was able to repeat key elements of the plan. All questions were answered to their satisfaction.  Smiley Dung, MD  I, Smiley Dung, MD, have reviewed all documentation for this visit. The documentation on 08/17/23 for the exam,  diagnosis, procedures, and orders are all accurate and complete.   IF YOU HAVE BEEN REFERRED TO A SPECIALIST, IT MAY TAKE 1-2 WEEKS TO SCHEDULE/PROCESS THE REFERRAL. IF YOU HAVE NOT HEARD FROM US /SPECIALIST IN TWO WEEKS, PLEASE GIVE US  A CALL AT [phone] X [phone].   THE PATIENT IS ENCOURAGED TO PRACTICE SOCIAL DISTANCING DUE TO THE COVID-19 PANDEMIC.

## 2023-09-25 ENCOUNTER — Encounter: Payer: Self-pay | Admitting: Internal Medicine

## 2023-09-25 ENCOUNTER — Ambulatory Visit: Payer: Self-pay | Admitting: Internal Medicine

## 2023-09-25 ENCOUNTER — Ambulatory Visit: Admitting: Internal Medicine

## 2023-09-25 VITALS — BP 116/80 | HR 97 | Temp 98.1°F | Ht 62.0 in | Wt 237.8 lb

## 2023-09-25 DIAGNOSIS — R5383 Other fatigue: Secondary | ICD-10-CM

## 2023-09-25 DIAGNOSIS — R109 Unspecified abdominal pain: Secondary | ICD-10-CM

## 2023-09-25 DIAGNOSIS — Z6841 Body Mass Index (BMI) 40.0 and over, adult: Secondary | ICD-10-CM

## 2023-09-25 DIAGNOSIS — M545 Low back pain, unspecified: Secondary | ICD-10-CM | POA: Diagnosis not present

## 2023-09-25 DIAGNOSIS — R7303 Prediabetes: Secondary | ICD-10-CM

## 2023-09-25 DIAGNOSIS — E66813 Obesity, class 3: Secondary | ICD-10-CM

## 2023-09-25 DIAGNOSIS — Z79899 Other long term (current) drug therapy: Secondary | ICD-10-CM

## 2023-09-25 LAB — POCT URINALYSIS DIP (CLINITEK)
Bilirubin, UA: NEGATIVE
Glucose, UA: NEGATIVE mg/dL
Ketones, POC UA: NEGATIVE mg/dL
Leukocytes, UA: NEGATIVE
Nitrite, UA: NEGATIVE
POC PROTEIN,UA: NEGATIVE
Spec Grav, UA: 1.025 (ref 1.010–1.025)
Urobilinogen, UA: 0.2 U/dL — NL
pH, UA: 5.5 (ref 5.0–8.0)

## 2023-09-25 MED ORDER — CYCLOBENZAPRINE HCL 5 MG PO TABS: 5.0000 mg | ORAL_TABLET | Freq: Three times a day (TID) | ORAL | 0 refills | Status: AC | PRN

## 2023-09-25 NOTE — Progress Notes (Signed)
 I,[name], CMA,acting as a Neurosurgeon for [name], MD.,have documented all relevant documentation on the behalf of [name], MD,as directed by  [name], MD while in the presence of [name], MD.  Subjective:  Patient ID: Linda Richards , female    DOB: [birth year] , 38 y.o.   MRN: [id number]  Chief Complaint  Patient presents with   Back Pain    Patient presents today for midline back pain. Initially started 2 weeks ago. She states the pain feels  deeper than muscle pain. She states she intermediately goes to the gym. Due to this pain she has stopped the metformin . She has not noticed any urinary concerns. While here today she wants to check her kidney function.     HPI Discussed the use of AI scribe software for clinical note transcription with the patient, who gave verbal consent to proceed.  History of Present Illness Linda Richards is a 38 year old female who presents with back pain and possible kidney stone.  She has been experiencing back pain for approximately two weeks, which began after resuming her regular exercise routine. Initially, the pain was similar to muscle soreness from gym activities, but it has since become duller and deeper, with a shooting sensation upwards from her back. The pain has been persistent, prompting her to seek medical evaluation. She has not taken any pain medication.  She has a history of a kidney infection in her youth, which the current pain somewhat reminds her of. No midline spinal pain is noted.  She has been taking metformin  but stopped about a week ago due to the pain. She has been experiencing fatigue and low energy levels, despite taking a daily B12 gummy. She received a B12 shot two weeks ago, which she feels has improved her energy slightly. She does not regularly use electrolyte powders.  She reports tenderness in her back and glutes, particularly on the right side, but no significant pain in the midline of her  spine. She works at [employer] job, which may contribute to her symptoms. She has not noticed any changes in her hair and is currently taking vitamin D .  Past Medical History:  Diagnosis Date   Family history of adverse reaction to anesthesia    patient states mother had reaction with surgery but can not remember what   UTI (urinary tract infection)      Family History  Problem Relation Age of Onset   Kidney disease Mother    Diabetes Mother    Kidney disease Father    Cancer Father        prostate   Diabetes Father    Hypertension Father    Cancer Paternal Grandmother      Current Outpatient Medications:    cyclobenzaprine (FLEXERIL) 5 MG tablet, Take 1 tablet (5 mg total) by mouth 3 (three) times daily as needed for muscle spasms., Disp: 30 tablet, Rfl: 0   metFORMIN  (GLUMETZA ) 500 MG (MOD) 24 hr tablet, TAKE 1 TABLET(500 MG) BY MOUTH DAILY WITH BREAKFAST (Patient not taking: Reported on 09/25/2023), Disp: 90 tablet, Rfl: 2   Allergies  Allergen Reactions   Penicillins Swelling     Review of Systems  Constitutional: Negative.   Respiratory: Negative.    Cardiovascular: Negative.   Gastrointestinal: Negative.   Musculoskeletal:  Positive for back pain.  Neurological: Negative.   Psychiatric/Behavioral: Negative.       Today's Vitals   09/25/23 1609  BP: 116/80  Pulse: 97  Temp: 98.1 F (36.7 C)  SpO2: 98%  Weight: 237 lb 12.8 oz (107.9 kg)  Height: 5' 2 (1.575 m)   Body mass index is 43.49 kg/m.  Wt Readings from Last 3 Encounters:  09/25/23 237 lb 12.8 oz (107.9 kg)  05/30/23 239 lb (108.4 kg)  04/19/23 239 lb 6.4 oz (108.6 kg)     Objective:  Physical Exam Vitals and nursing note reviewed.  Constitutional:      Appearance: Normal appearance. She is obese.  HENT:     Head: Normocephalic and atraumatic.   Eyes:     Extraocular Movements: Extraocular movements intact.    Cardiovascular:     Rate and Rhythm: Normal rate and regular rhythm.     Heart  sounds: Normal heart sounds.  Pulmonary:     Effort: Pulmonary effort is normal.     Breath sounds: Normal breath sounds.  Abdominal:     Comments: R CVA tenderness to percussion   Musculoskeletal:        General: Tenderness present.     Cervical back: Normal range of motion.   Skin:    General: Skin is warm.   Neurological:     General: No focal deficit present.     Mental Status: She is alert.   Psychiatric:        Mood and Affect: Mood normal.        Behavior: Behavior normal.         Assessment And Plan:  Acute right-sided low back pain without sciatica Assessment & Plan: Pain likely muscular strain. Differential includes kidney stone due to history of kidney infection. - Obtain urine sample for hematuria to rule out kidney stone. - Prescribe nighttime muscle relaxer for pain relief. - Advise hip opening stretches.   Right flank pain -     POCT URINALYSIS DIP (CLINITEK)  Prediabetes Assessment & Plan: Previous labs reviewed, her A1c has been elevated in the past. She is now taking metformin  once daily.  I will check BMP today.  I planned to increase to twice daily dosing.  She is reminded to avoid sugary foods/drinks and processed meats including bacon, sausages and deli meats. She will rto in 3-4 months for re-evaluation.   Orders: -     Hemoglobin A1c  Other fatigue Assessment & Plan: Fatigue possibly due to electrolyte imbalance from exercise. - Check complete blood count and B12 levels. - Recommend electrolyte powders thrice weekly. - Advise against Liquid IV, suggest Nectar electrolyte powders.  Orders: -     CBC -     Vitamin B12 -     Iron, TIBC and Ferritin Panel  Class 3 severe obesity due to excess calories with body mass index (BMI) of 40.0 to 44.9 in adult, unspecified whether serious comorbidity present Assessment & Plan: BMI 43.  She is encouraged to initially strive for BMI less than 35 to decrease cardiac risk. Advised to aim for at least  150 minutes of exercise per week.       Drug therapy -     BMP8+eGFR  Other orders -     Cyclobenzaprine HCl; Take 1 tablet (5 mg total) by mouth 3 (three) times daily as needed for muscle spasms.  Dispense: 30 tablet; Refill: 0   Return if symptoms worsen or fail to improve.  Patient was given opportunity to ask questions. Patient verbalized understanding of the plan and was able to repeat key elements of the plan. All questions were answered  to their satisfaction.   I, [name], MD, have reviewed all documentation for this visit. The documentation on 09/25/23 for the exam, diagnosis, procedures, and orders are all accurate and complete.   IF YOU HAVE BEEN REFERRED TO A SPECIALIST, IT MAY TAKE 1-2 WEEKS TO SCHEDULE/PROCESS THE REFERRAL. IF YOU HAVE NOT HEARD FROM US /SPECIALIST IN TWO WEEKS, PLEASE GIVE US  A CALL AT [phone] X [phone].   THE PATIENT IS ENCOURAGED TO PRACTICE SOCIAL DISTANCING DUE TO THE COVID-19 PANDEMIC.

## 2023-09-25 NOTE — Patient Instructions (Signed)
 Acute Back Pain, Adult Acute back pain is sudden and usually short-lived. It is often caused by an injury to the muscles and tissues in the back. The injury may result from: A muscle, tendon, or ligament getting overstretched or torn. Ligaments are tissues that connect bones to each other. Lifting something improperly can cause a back strain. Wear and tear (degeneration) of the spinal disks. Spinal disks are circular tissue that provide cushioning between the bones of the spine (vertebrae). Twisting motions, such as while playing sports or doing yard work. A hit to the back. Arthritis. You may have a physical exam, lab tests, and imaging tests to find the cause of your pain. Acute back pain usually goes away with rest and home care. Follow these instructions at home: Managing pain, stiffness, and swelling Take over-the-counter and prescription medicines only as told by your health care provider. Treatment may include medicines for pain and inflammation that are taken by mouth or applied to the skin, or muscle relaxants. Your health care provider may recommend applying ice during the first 24-48 hours after your pain starts. To do this: Put ice in a plastic bag. Place a towel between your skin and the bag. Leave the ice on for 20 minutes, 2-3 times a day. Remove the ice if your skin turns bright red. This is very important. If you cannot feel pain, heat, or cold, you have a greater risk of damage to the area. If directed, apply heat to the affected area as often as told by your health care provider. Use the heat source that your health care provider recommends, such as a moist heat pack or a heating pad. Place a towel between your skin and the heat source. Leave the heat on for 20-30 minutes. Remove the heat if your skin turns bright red. This is especially important if you are unable to feel pain, heat, or cold. You have a greater risk of getting burned. Activity  Do not stay in bed. Staying in  bed for more than 1-2 days can delay your recovery. Sit up and stand up straight. Avoid leaning forward when you sit or hunching over when you stand. If you work at a desk, sit close to it so you do not need to lean over. Keep your chin tucked in. Keep your neck drawn back, and keep your elbows bent at a 90-degree angle (right angle). Sit high and close to the steering wheel when you drive. Add lower back (lumbar) support to your car seat, if needed. Take short walks on even surfaces as soon as you are able. Try to increase the length of time you walk each day. Do not sit, drive, or stand in one place for more than 30 minutes at a time. Sitting or standing for long periods of time can put stress on your back. Do not drive or use heavy machinery while taking prescription pain medicine. Use proper lifting techniques. When you bend and lift, use positions that put less stress on your back: [place] your knees. Keep the load close to your body. Avoid twisting. Exercise regularly as told by your health care provider. Exercising helps your back heal faster and helps prevent back injuries by keeping muscles strong and flexible. Work with a physical therapist to make a safe exercise program, as recommended by your health care provider. Do any exercises as told by your physical therapist. Lifestyle Maintain a healthy weight. Extra weight puts stress on your back and makes it difficult to have good  posture. Avoid activities or situations that make you feel anxious or stressed. Stress and anxiety increase muscle tension and can make back pain worse. Learn ways to manage anxiety and stress, such as through exercise. General instructions Sleep on a firm mattress in a comfortable position. Try lying on your side with your knees slightly bent. If you lie on your back, put a pillow under your knees. Keep your head and neck in a straight line with your spine (neutral position) when using electronic equipment like  smartphones or pads. To do this: Raise your smartphone or pad to look at it instead of bending your head or neck to look down. Put the smartphone or pad at the level of your face while looking at the screen. Follow your treatment plan as told by your health care provider. This may include: Cognitive or behavioral therapy. Acupuncture or massage therapy. Meditation or yoga. Contact a health care provider if: You have pain that is not relieved with rest or medicine. You have increasing pain going down into your legs or buttocks. Your pain does not improve after 2 weeks. You have pain at night. You lose weight without trying. You have a fever or chills. You develop nausea or vomiting. You develop abdominal pain. Get help right away if: You develop new bowel or bladder control problems. You have unusual weakness or numbness in your arms or legs. You feel faint. These symptoms may represent a serious problem that is an emergency. Do not wait to see if the symptoms will go away. Get medical help right away. Call your local emergency services (911 in the [place] Do not drive yourself to the hospital. Summary Acute back pain is sudden and usually short-lived. Use proper lifting techniques. When you bend and lift, use positions that put less stress on your back. Take over-the-counter and prescription medicines only as told by your health care provider, and apply heat or ice as told. This information is not intended to replace advice given to you by your health care provider. Make sure you discuss any questions you have with your health care provider. Document Revised: 06/19/2020 Document Reviewed: 06/19/2020 Elsevier Patient Education  2024 [employer].

## 2023-09-25 NOTE — Progress Notes (Signed)
 Your urine shows trace blood. Please monitor your urine for next few days and let me know if you noticed anything unusual.  Last cycle was 5/31 correct? Was this the beginning or end of your cycle?   Stay hydrated. You can add splash of cranberry juice to your water as well. Or add lemon juice to your water.   Take care,  RS

## 2023-09-26 LAB — BMP8+EGFR
BUN/Creatinine Ratio: 8 — ABNORMAL LOW (ref 9–23)
BUN: 8 mg/dL (ref 6–20)
CO2: 23 mmol/L (ref 20–29)
Calcium: 9.3 mg/dL (ref 8.7–10.2)
Chloride: 102 mmol/L (ref 96–106)
Creatinine, Ser: 0.98 mg/dL (ref 0.57–1.00)
Glucose: 84 mg/dL (ref 70–99)
Potassium: 4.2 mmol/L (ref 3.5–5.2)
Sodium: 137 mmol/L (ref 134–144)
eGFR: 76 mL/min/{1.73_m2} (ref >59)

## 2023-09-26 LAB — IRON,TIBC AND FERRITIN PANEL
Ferritin: 66 ng/mL (ref 15–150)
Iron Saturation: 15 % (ref 15–55)
Iron: 56 ug/dL (ref 27–159)
Total Iron Binding Capacity: 377 ug/dL (ref 250–450)
UIBC: 321 ug/dL (ref 131–425)

## 2023-09-26 LAB — CBC
Hematocrit: 41.7 % (ref 34.0–46.6)
Hemoglobin: 13.7 g/dL (ref 11.1–15.9)
MCH: 31.7 pg (ref 26.6–33.0)
MCHC: 32.9 g/dL (ref 31.5–35.7)
MCV: 97 fL (ref 79–97)
Platelets: 235 10*3/uL (ref 150–450)
RBC: 4.32 x10E6/uL (ref 3.77–5.28)
RDW: 12.6 % (ref 11.7–15.4)
WBC: 9.8 10*3/uL (ref 3.4–10.8)

## 2023-09-26 LAB — HEMOGLOBIN A1C
Est. average glucose Bld gHb Est-mCnc: 126 mg/dL
Hgb A1c MFr Bld: 6 % — ABNORMAL HIGH (ref 4.8–5.6)

## 2023-09-26 LAB — VITAMIN B12: Vitamin B-12: 1404 pg/mL — ABNORMAL HIGH (ref 232–1245)

## 2023-09-28 ENCOUNTER — Ambulatory Visit: Admitting: Internal Medicine

## 2023-10-01 DIAGNOSIS — R109 Unspecified abdominal pain: Secondary | ICD-10-CM | POA: Insufficient documentation

## 2023-10-01 DIAGNOSIS — M545 Low back pain, unspecified: Secondary | ICD-10-CM | POA: Insufficient documentation

## 2023-10-01 DIAGNOSIS — R5383 Other fatigue: Secondary | ICD-10-CM | POA: Insufficient documentation

## 2023-10-01 NOTE — Assessment & Plan Note (Signed)
 Pain likely muscular strain. Differential includes kidney stone due to history of kidney infection. - Obtain urine sample for hematuria to rule out kidney stone. - Prescribe nighttime muscle relaxer for pain relief. - Advise hip opening stretches.

## 2023-10-01 NOTE — Assessment & Plan Note (Signed)
 Previous labs reviewed, her A1c has been elevated in the past. She is now taking metformin once daily.  I will check BMP today.  I planned to increase to twice daily dosing.  She is reminded to avoid sugary foods/drinks and processed meats including bacon, sausages and deli meats. She will rto in 3-4 months for re-evaluation.

## 2023-10-01 NOTE — Assessment & Plan Note (Signed)
 BMI 43.  She is encouraged to initially strive for BMI less than 35 to decrease cardiac risk. Advised to aim for at least 150 minutes of exercise per week.

## 2023-10-01 NOTE — Assessment & Plan Note (Signed)
 Fatigue possibly due to electrolyte imbalance from exercise. - Check complete blood count and B12 levels. - Recommend electrolyte powders thrice weekly. - Advise against Liquid IV, suggest Nectar electrolyte powders.

## 2023-10-20 ENCOUNTER — Telehealth: Admitting: Physician Assistant

## 2023-10-20 DIAGNOSIS — H109 Unspecified conjunctivitis: Secondary | ICD-10-CM | POA: Diagnosis not present

## 2023-10-20 MED ORDER — MOXIFLOXACIN HCL 0.5 % OP SOLN
1.0000 [drp] | Freq: Three times a day (TID) | OPHTHALMIC | 0 refills | Status: AC
Start: 2023-10-20 — End: ?

## 2023-10-20 NOTE — Progress Notes (Signed)
 Virtual Visit Consent   Linda Richards, you are scheduled for a virtual visit with a [facility] provider today. Just as with appointments in the office, your consent must be obtained to participate. Your consent will be active for this visit and any virtual visit you may have with one of our providers in the next 365 days. If you have a MyChart account, a copy of this consent can be sent to you electronically.  As this is a virtual visit, video technology does not allow for your provider to perform a traditional examination. This may limit your provider's ability to fully assess your condition. If your provider identifies any concerns that need to be evaluated in person or the need to arrange testing (such as labs, EKG, etc.), we will make arrangements to do so. Although advances in technology are sophisticated, we cannot ensure that it will always work on either your end or our end. If the connection with a video visit is poor, the visit may have to be switched to a telephone visit. With either a video or telephone visit, we are not always able to ensure that we have a secure connection.  By engaging in this virtual visit, you consent to the provision of healthcare and authorize for your insurance to be billed (if applicable) for the services provided during this visit. Depending on your insurance coverage, you may receive a charge related to this service.  I need to obtain your verbal consent now. Are you willing to proceed with your visit today? Linda Richards has provided verbal consent on 10/20/2023 for a virtual visit (video or telephone). Delon CHRISTELLA Dickinson, PA-C  Date: 10/20/2023 10:10 AM   Virtual Visit via Video Note   I, Delon CHRISTELLA Dickinson, connected with  Linda Richards  ([id number], 1985/09/12) on 10/20/23 at 10:00 AM EDT by a video-enabled telemedicine application and verified that I am speaking with the correct person using two identifiers.  Location: Patient: Virtual Visit Location  Patient: Home Provider: Virtual Visit Location Provider: Home Office   I discussed the limitations of evaluation and management by telemedicine and the availability of in person appointments. The patient expressed understanding and agreed to proceed.    History of Present Illness: Linda Richards is a 37 y.o. who identifies as a female who was assigned female at birth, and is being seen today for pink eye with crusting.  HPI: Conjunctivitis  The current episode started 5 to 7 days ago (went swimming on July 4th, dveloped allergy symptoms that progressed and she started her allergy medications and mucinex that has been helping, but then had left eye start watering yesterday and then crusted shut and red this morning). The onset was sudden. The problem occurs continuously. The problem has been gradually worsening. The problem is mild. Nothing relieves the symptoms. Nothing aggravates the symptoms. Associated symptoms include eye itching (burning), congestion, sore throat (improving), URI (improving), eye discharge and eye redness. Pertinent negatives include no fever, no decreased vision, no double vision, no photophobia and no eye pain. The eye pain is mild. The left eye is affected. The eye pain is not associated with movement. The eyelid exhibits no abnormality.     Problems:  Patient Active Problem List   Diagnosis Date Noted   Right flank pain 10/01/2023   Other fatigue 10/01/2023   Midline low back pain without sciatica 10/01/2023   Weight gain 04/22/2023   Annual physical exam 12/18/2022   Prediabetes 10/23/2022   Counseling for birth control, oral contraceptives  10/23/2022   Obesity, unspecified 10/23/2022    Allergies:  Allergies  Allergen Reactions   Penicillins Swelling   Medications:  Current Outpatient Medications:    cyclobenzaprine  (FLEXERIL ) 5 MG tablet, Take 1 tablet (5 mg total) by mouth 3 (three) times daily as needed for muscle spasms., Disp: 30 tablet, Rfl: 0    metFORMIN  (GLUMETZA ) 500 MG (MOD) 24 hr tablet, TAKE 1 TABLET(500 MG) BY MOUTH DAILY WITH BREAKFAST (Patient not taking: Reported on 09/25/2023), Disp: 90 tablet, Rfl: 2   moxifloxacin  (VIGAMOX ) 0.5 % ophthalmic solution, Place 1 drop into both eyes 3 (three) times daily., Disp: 3 mL, Rfl: 0  Observations/Objective: Patient is well-developed, well-nourished in no acute distress.  Resting comfortably at home.  Head is normocephalic, atraumatic.  No labored breathing.  Speech is clear and coherent with logical content.  Patient is alert and oriented at baseline.  Left conjunctiva is injected, there was watering and crusting this morning; EOM grossly intact, pupils are round and equal  Assessment and Plan: 1. Bacterial conjunctivitis of left eye (Primary) - moxifloxacin  (VIGAMOX ) 0.5 % ophthalmic solution; Place 1 drop into both eyes 3 (three) times daily.  Dispense: 3 mL; Refill: 0  - Suspect bacterial conjunctivitis - Vigamox  prescribed - Continue allergy medications and Mucinex for congestion - Warm compresses - Good hand hygiene - Seek in person evaluation if symptoms worsen or fail to improve   Follow Up Instructions: I discussed the assessment and treatment plan with the patient. The patient was provided an opportunity to ask questions and all were answered. The patient agreed with the plan and demonstrated an understanding of the instructions.  A copy of instructions were sent to the patient via MyChart unless otherwise noted below.    The patient was advised to call back or seek an in-person evaluation if the symptoms worsen or if the condition fails to improve as anticipated.    Delon CHRISTELLA Dickinson, PA-C

## 2023-10-20 NOTE — Patient Instructions (Signed)
 Vella Essex, thank you for joining Delon CHRISTELLA Dickinson, PA-C for today's virtual visit.  While this provider is not your primary care provider (PCP), if your PCP is located in our provider database this encounter information will be shared with them immediately following your visit.   A [facility] MyChart account gives you access to today's visit and all your visits, tests, and labs performed at [facility]  click here if you don't have a [facility] MyChart account or go to [email or web address]  Consent: (Patient) Linda Richards provided verbal consent for this virtual visit at the beginning of the encounter.  Current Medications:  Current Outpatient Medications:    cyclobenzaprine  (FLEXERIL ) 5 MG tablet, Take 1 tablet (5 mg total) by mouth 3 (three) times daily as needed for muscle spasms., Disp: 30 tablet, Rfl: 0   metFORMIN  (GLUMETZA ) 500 MG (MOD) 24 hr tablet, TAKE 1 TABLET(500 MG) BY MOUTH DAILY WITH BREAKFAST (Patient not taking: Reported on 09/25/2023), Disp: 90 tablet, Rfl: 2   moxifloxacin  (VIGAMOX ) 0.5 % ophthalmic solution, Place 1 drop into both eyes 3 (three) times daily., Disp: 3 mL, Rfl: 0   Medications ordered in this encounter:  Meds ordered this encounter  Medications   moxifloxacin  (VIGAMOX ) 0.5 % ophthalmic solution    Sig: Place 1 drop into both eyes 3 (three) times daily.    Dispense:  3 mL    Refill:  0    Supervising Provider:   BLAISE ALEENE KIDD [[id number]]     *If you need refills on other medications prior to your next appointment, please contact your pharmacy*  Follow-Up: Call back or seek an in-person evaluation if the symptoms worsen or if the condition fails to improve as anticipated.  [facility] [phone]  Other Instructions Bacterial Conjunctivitis, Adult Bacterial conjunctivitis is an infection of the clear membrane that covers the white part of the eye and the inner surface of the eyelid (conjunctiva). When  the blood vessels in the conjunctiva become inflamed, the eye becomes red or pink. The eye often feels irritated or itchy. Bacterial conjunctivitis spreads easily from person to person (is contagious). It also spreads easily from one eye to the other eye. What are the causes? This condition is caused by bacteria. You may get the infection if you come into close contact with: A person who is infected with the bacteria. Items that are contaminated with the bacteria, such as a face towel, contact lens solution, or eye makeup. What increases the risk? You are more likely to develop this condition if: You are exposed to other people who have the infection. You wear contact lenses. You have a sinus infection. You have had a recent eye injury or surgery. You have a weak body defense system (immune system). You have a medical condition that causes dry eyes. What are the signs or symptoms? Symptoms of this condition include: Thick, yellowish discharge from the eye. This may turn into a crust on the eyelid overnight and cause your eyelids to stick together. Tearing or watery eyes. Itchy eyes. Burning feeling in your eyes. Eye redness. Swollen eyelids. Blurred vision. How is this diagnosed? This condition is diagnosed based on your symptoms and medical history. Your health care provider may also take a sample of discharge from your eye to find the cause of your infection. How is this treated? This condition may be treated with: Antibiotic eye drops or ointment to clear the infection more quickly and prevent the spread of infection to  others. Antibiotic medicines taken by mouth (orally) to treat infections that do not respond to drops or ointments or that last longer than 10 days. Cool, wet cloths (cool compresses) placed on the eyes. Artificial tears applied 2-6 times a day. Follow these instructions at home: Medicines Take or apply your antibiotic medicine as told by your health care provider.  Do not stop using the antibiotic, even if your condition improves, unless directed by your health care provider. Take or apply over-the-counter and prescription medicines only as told by your health care provider. Be very careful to avoid touching the edge of your eyelid with the eye-drop bottle or the ointment tube when you apply medicines to the affected eye. This will keep you from spreading the infection to your other eye or to other people. Managing discomfort Gently wipe away any drainage from your eye with a warm, wet washcloth or a cotton ball. Apply a clean, cool compress to your eye for 10-20 minutes, 3-4 times a day. General instructions Do not wear contact lenses until the inflammation is gone and your health care provider says it is safe to wear them again. Ask your health care provider how to sterilize or replace your contact lenses before you use them again. Wear glasses until you can resume wearing contact lenses. Avoid wearing eye makeup until the inflammation is gone. Throw away any old eye cosmetics that may be contaminated. Change or wash your pillowcase every day. Do not share towels or washcloths. This may spread the infection. Wash your hands often with soap and water for at least 20 seconds and especially before touching your face or eyes. Use paper towels to dry your hands. Avoid touching or rubbing your eyes. Do not drive or use heavy machinery if your vision is blurred. Contact a health care provider if: You have a fever. Your symptoms do not get better after 10 days. Get help right away if: You have a fever and your symptoms suddenly get worse. You have severe pain when you move your eye. You have facial pain, redness, or swelling. You have a sudden loss of vision. Summary Bacterial conjunctivitis is an infection of the clear membrane that covers the white part of the eye and the inner surface of the eyelid (conjunctiva). Bacterial conjunctivitis spreads easily  from eye to eye and from person to person (is contagious). Wash your hands often with soap and water for at least 20 seconds and especially before touching your face or eyes. Use paper towels to dry your hands. Take or apply your antibiotic medicine as told by your health care provider. Do not stop using the antibiotic even if your condition improves. Contact a health care provider if you have a fever or if your symptoms do not get better after 10 days. Get help right away if you have a sudden loss of vision. This information is not intended to replace advice given to you by your health care provider. Make sure you discuss any questions you have with your health care provider. Document Revised: 07/08/2020 Document Reviewed: 07/08/2020 Elsevier Patient Education  2024 [employer] Inc.   If you have been instructed to have an in-person evaluation today at a local Urgent Care facility, please use the link below. It will take you to a list of all of our available [facility], including address, phone number and hours of operation. Please do not delay care.  [facility]  If you or a family member do not have  a primary care provider, use the link below to schedule a visit and establish care. When you choose a [facility] primary care physician or advanced practice provider, you gain a long-term partner in health. Find a Primary Care Provider  Learn more about [facility] in-office and virtual care options: [facility] Get Care Now

## 2023-10-30 ENCOUNTER — Ambulatory Visit (INDEPENDENT_AMBULATORY_CARE_PROVIDER_SITE_OTHER): Admitting: Internal Medicine

## 2023-10-30 ENCOUNTER — Encounter: Payer: Self-pay | Admitting: Internal Medicine

## 2023-10-30 VITALS — BP 110/82 | HR 94 | Temp 98.8°F | Ht 62.0 in | Wt 238.4 lb

## 2023-10-30 DIAGNOSIS — J0100 Acute maxillary sinusitis, unspecified: Secondary | ICD-10-CM

## 2023-10-30 MED ORDER — DOXYCYCLINE HYCLATE 100 MG PO TABS
100.0000 mg | ORAL_TABLET | Freq: Two times a day (BID) | ORAL | 0 refills | Status: DC
Start: 2023-10-30 — End: 2023-12-20

## 2023-10-30 NOTE — Patient Instructions (Signed)

## 2023-10-30 NOTE — Progress Notes (Signed)
 I,[name], CMA,acting as a Neurosurgeon for [name], MD.,have documented all relevant documentation on the behalf of [name], MD,as directed by  [name], MD while in the presence of [name], MD.  Subjective:  Patient ID: Linda Richards , female    DOB: [birth year] , 38 y.o.   MRN: [id number]  Chief Complaint  Patient presents with   Cough    Patient presents today for ongoing cough. This initially started 07/04 weekend. She experiences SOB due to this cough along with chest tightness & wheezing.  Denies chills. Last night she states feeling warm in the face but does not know if that would be related to a fever.     HPI Discussed the use of AI scribe software for clinical note transcription with the patient, who gave verbal consent to proceed.  History of Present Illness Linda Richards is a 38 year old female who presents with persistent cough and congestion.  She has been experiencing a cough and congestion for several weeks, beginning around the Fourth of July. Initially, there was a tickle in her throat that progressed to deeper chest congestion over the weekend.  She has been taking Mucinex and an off-brand allergy medication, which she believes is similar to Prilosec, throughout this period. Despite these medications, she continues to cough up mucus, which has been described as thick and varying in color from white to yellow.  Her nasal discharge is clear to yellowish, and she experiences tenderness in her cheeks but no dental pain. She feels winded with exertion but can take deep breaths.  No known recent exposure to sick individuals and has not taken a COVID test during this illness.  She has a known allergy to penicillin.   Past Medical History:  Diagnosis Date   Family history of adverse reaction to anesthesia    patient states mother had reaction with surgery but can not remember what   UTI (urinary tract infection)      Family History   Problem Relation Age of Onset   Kidney disease Mother    Diabetes Mother    Kidney disease Father    Cancer Father        prostate   Diabetes Father    Hypertension Father    Cancer Paternal Grandmother      Current Outpatient Medications:    doxycycline  (VIBRA -TABS) 100 MG tablet, Take 1 tablet (100 mg total) by mouth 2 (two) times daily., Disp: 20 tablet, Rfl: 0   moxifloxacin  (VIGAMOX ) 0.5 % ophthalmic solution, Place 1 drop into both eyes 3 (three) times daily., Disp: 3 mL, Rfl: 0   cyclobenzaprine  (FLEXERIL ) 5 MG tablet, Take 1 tablet (5 mg total) by mouth 3 (three) times daily as needed for muscle spasms. (Patient not taking: Reported on 10/30/2023), Disp: 30 tablet, Rfl: 0   metFORMIN  (GLUMETZA ) 500 MG (MOD) 24 hr tablet, TAKE 1 TABLET(500 MG) BY MOUTH DAILY WITH BREAKFAST (Patient not taking: Reported on 10/30/2023), Disp: 90 tablet, Rfl: 2   Allergies  Allergen Reactions   Penicillins Swelling     Review of Systems  Constitutional: Negative.   HENT:  Positive for congestion and postnasal drip.   Respiratory:  Positive for cough.   Cardiovascular: Negative.   Gastrointestinal: Negative.   Neurological: Negative.   Psychiatric/Behavioral: Negative.       Today's Vitals   10/30/23 1131  BP: 110/82  Pulse: 94  Temp: 98.8 F (37.1 C)  SpO2: 98%  Weight:  238 lb 6.4 oz (108.1 kg)  Height: 5' 2 (1.575 m)   Body mass index is 43.6 kg/m.  Wt Readings from Last 3 Encounters:  10/30/23 238 lb 6.4 oz (108.1 kg)  09/25/23 237 lb 12.8 oz (107.9 kg)  05/30/23 239 lb (108.4 kg)     Objective:  Physical Exam Vitals and nursing note reviewed.  Constitutional:      Appearance: She is ill-appearing.  HENT:     Head: Normocephalic and atraumatic.     Comments: Maxillary sinus tenderness to percussion    Right Ear: Tympanic membrane, ear canal and external ear normal. There is no impacted cerumen.     Left Ear: Tympanic membrane, ear canal and external ear normal. There  is no impacted cerumen.     Mouth/Throat:     Pharynx: No oropharyngeal exudate or posterior oropharyngeal erythema.  Eyes:     Extraocular Movements: Extraocular movements intact.  Cardiovascular:     Rate and Rhythm: Normal rate and regular rhythm.     Heart sounds: Normal heart sounds.  Pulmonary:     Effort: Pulmonary effort is normal.     Breath sounds: Normal breath sounds.  Musculoskeletal:     Cervical back: Normal range of motion.  Skin:    General: Skin is warm.  Neurological:     General: No focal deficit present.     Mental Status: She is alert.  Psychiatric:        Mood and Affect: Mood normal.        Behavior: Behavior normal.         Assessment And Plan:  Acute non-recurrent maxillary sinusitis Assessment & Plan: Symptoms suggest upper respiratory infection; bacterial sinusitis considered due to duration and nature. Doxycycline  prescribed due to penicillin allergy. - Prescribe doxycycline  twice daily. - Advise avoiding direct sunlight due to doxycycline  photosensitivity. - Recommend hot beverages daily, avoiding dairy. - Encourage use of lemon and local honey in tea to thin mucus and provide antibacterial effects. - Provide sample of Norel decongestant for nighttime use. - Advise obtaining COVID-19 tests for future use and testing within 5 days of symptom onset if symptoms recur. - Request update on symptoms by Wednesday. - Consider steroid injection if symptoms do not improve by Wednesday.   Other orders -     Doxycycline  Hyclate; Take 1 tablet (100 mg total) by mouth 2 (two) times daily.  Dispense: 20 tablet; Refill: 0   Return if symptoms worsen or fail to improve.  Patient was given opportunity to ask questions. Patient verbalized understanding of the plan and was able to repeat key elements of the plan. All questions were answered to their satisfaction.   I, [name], MD, have reviewed all documentation for this visit. The documentation on  10/30/23 for the exam, diagnosis, procedures, and orders are all accurate and complete.   IF YOU HAVE BEEN REFERRED TO A SPECIALIST, IT MAY TAKE 1-2 WEEKS TO SCHEDULE/PROCESS THE REFERRAL. IF YOU HAVE NOT HEARD FROM US /SPECIALIST IN TWO WEEKS, PLEASE GIVE US  A CALL AT [phone].   THE PATIENT IS ENCOURAGED TO PRACTICE SOCIAL DISTANCING DUE TO THE COVID-19 PANDEMIC.

## 2023-11-05 NOTE — Assessment & Plan Note (Signed)
 Symptoms suggest upper respiratory infection; bacterial sinusitis considered due to duration and nature. Doxycycline  prescribed due to penicillin allergy. - Prescribe doxycycline  twice daily. - Advise avoiding direct sunlight due to doxycycline  photosensitivity. - Recommend hot beverages daily, avoiding dairy. - Encourage use of lemon and local honey in tea to thin mucus and provide antibacterial effects. - Provide sample of Norel decongestant for nighttime use. - Advise obtaining COVID-19 tests for future use and testing within 5 days of symptom onset if symptoms recur. - Request update on symptoms by Wednesday. - Consider steroid injection if symptoms do not improve by Wednesday.

## 2023-12-20 ENCOUNTER — Encounter: Payer: Self-pay | Admitting: Internal Medicine

## 2023-12-20 ENCOUNTER — Ambulatory Visit: Payer: 59 | Admitting: Internal Medicine

## 2023-12-20 VITALS — BP 110/80 | HR 86 | Temp 98.1°F | Ht 62.0 in | Wt 235.8 lb

## 2023-12-20 DIAGNOSIS — Z6841 Body Mass Index (BMI) 40.0 and over, adult: Secondary | ICD-10-CM

## 2023-12-20 DIAGNOSIS — R7303 Prediabetes: Secondary | ICD-10-CM | POA: Diagnosis not present

## 2023-12-20 DIAGNOSIS — E66813 Obesity, class 3: Secondary | ICD-10-CM | POA: Diagnosis not present

## 2023-12-20 DIAGNOSIS — Z0000 Encounter for general adult medical examination without abnormal findings: Secondary | ICD-10-CM | POA: Diagnosis not present

## 2023-12-20 NOTE — Progress Notes (Signed)
 I,[name], CMA,acting as a Neurosurgeon for [name], MD.,have documented all relevant documentation on the behalf of [name], MD,as directed by  [name], MD while in the presence of [name], MD.  Subjective:    Patient ID: Linda Richards , female    DOB: 12/01/1985 , 38 y.o.   MRN: [id number]  Chief Complaint  Patient presents with   Annual Exam    Patient presents today for annual exam. Denies headache, chest pain & sob. She has no specific questions or concerns.  GYN: Dr Armond    Prediabetes    HPI Discussed the use of AI scribe software for clinical note transcription with the patient, who gave verbal consent to proceed.  History of Present Illness Linda Richards is a 38 year old female who presents for an annual physical exam.  She has a history of prediabetes and was previously prescribed metformin . She discontinued metformin  due to perceived ineffectiveness, as her A1c levels remained unchanged. She did not experience any adverse effects from the medication and still has some metformin  left from a previous 90-day prescription.  Her last menstrual cycle was on August 18th, and she confirms regular cycles. She is not currently performing regular breast self-exams.  She is less active with her regular exercise and boot camp activities. She manages her water intake well and reports no issues with bowel movements.  She has not visited a weight loss clinic but is interested in meal planning support. She is managing financial constraints due to her son's ACL surgery and associated medical bills.  She has not had a Pap smear since 2016 and is unsure about the results of any recent STI testing.   Past Medical History:  Diagnosis Date   Family history of adverse reaction to anesthesia    patient states mother had reaction with surgery but can not remember what   UTI (urinary tract infection)      Family History  Problem Relation Age of Onset    Kidney disease Mother    Diabetes Mother    Kidney disease Father    Cancer Father        prostate   Diabetes Father    Hypertension Father    Cancer Paternal Grandmother      Current Outpatient Medications:    moxifloxacin  (VIGAMOX ) 0.5 % ophthalmic solution, Place 1 drop into both eyes 3 (three) times daily., Disp: 3 mL, Rfl: 0   cyclobenzaprine  (FLEXERIL ) 5 MG tablet, Take 1 tablet (5 mg total) by mouth 3 (three) times daily as needed for muscle spasms. (Patient not taking: Reported on 12/20/2023), Disp: 30 tablet, Rfl: 0   metFORMIN  (GLUMETZA ) 500 MG (MOD) 24 hr tablet, TAKE 1 TABLET(500 MG) BY MOUTH DAILY WITH BREAKFAST (Patient not taking: Reported on 12/20/2023), Disp: 90 tablet, Rfl: 2   Allergies  Allergen Reactions   Penicillins Swelling      The patient states she uses none for birth control. Patient's last menstrual period was 11/27/2023 (exact date).. Negative for Dysmenorrhea. Negative for: breast discharge, breast lump(s), breast pain and breast self exam. Associated symptoms include abnormal vaginal bleeding. Pertinent negatives include abnormal bleeding (hematology), anxiety, decreased libido, depression, difficulty falling sleep, dyspareunia, history of infertility, nocturia, sexual dysfunction, sleep disturbances, urinary incontinence, urinary urgency, vaginal discharge and vaginal itching. Diet regular.    . The patient's tobacco use is:  Social History   Tobacco Use  Smoking Status Never  Smokeless Tobacco Never  . She  has been exposed to passive smoke. The patient's alcohol use is:  Social History   Substance and Sexual Activity  Alcohol Use Not Currently   Comment: social    Review of Systems  Constitutional: Negative.   HENT: Negative.    Eyes: Negative.   Respiratory: Negative.    Cardiovascular: Negative.   Gastrointestinal: Negative.   Endocrine: Negative.   Genitourinary: Negative.   Musculoskeletal: Negative.   Skin: Negative.    Allergic/Immunologic: Negative.   Neurological: Negative.   Hematological: Negative.   Psychiatric/Behavioral: Negative.       Today's Vitals   12/20/23 1436  BP: 110/80  Pulse: 86  Temp: 98.1 F (36.7 C)  SpO2: 98%  Weight: 235 lb 12.8 oz (107 kg)  Height: 5' 2 (1.575 m)   Body mass index is 43.13 kg/m.  Wt Readings from Last 3 Encounters:  12/20/23 235 lb 12.8 oz (107 kg)  10/30/23 238 lb 6.4 oz (108.1 kg)  09/25/23 237 lb 12.8 oz (107.9 kg)     Objective:  Physical Exam Vitals and nursing note reviewed.  Constitutional:      Appearance: Normal appearance. She is obese.  HENT:     Head: Normocephalic and atraumatic.     Right Ear: Tympanic membrane, ear canal and external ear normal.     Left Ear: Tympanic membrane, ear canal and external ear normal.     Nose: Nose normal.     Mouth/Throat:     Mouth: Mucous membranes are moist.     Pharynx: Oropharynx is clear.  Eyes:     Extraocular Movements: Extraocular movements intact.     Conjunctiva/sclera: Conjunctivae normal.     Pupils: Pupils are equal, round, and reactive to light.  Cardiovascular:     Rate and Rhythm: Normal rate and regular rhythm.     Pulses: Normal pulses.     Heart sounds: Normal heart sounds.  Pulmonary:     Effort: Pulmonary effort is normal.     Breath sounds: Normal breath sounds.  Chest:  Breasts:    Tanner Score is 5.     Right: Normal.     Left: Normal.     Comments: Well healed surgical scars b/l Abdominal:     General: Bowel sounds are normal.     Palpations: Abdomen is soft.     Comments: Rounded, soft  Genitourinary:    Comments: deferred Musculoskeletal:        General: Normal range of motion.     Cervical back: Normal range of motion and neck supple.  Skin:    General: Skin is warm and dry.  Neurological:     General: No focal deficit present.     Mental Status: She is alert and oriented to person, place, and time.  Psychiatric:        Mood and Affect: Mood  normal.        Behavior: Behavior normal.         Assessment And Plan:     Annual physical exam Assessment & Plan: A full exam was performed.  Importance of monthly self breast exams was discussed with the patient.  She is advised to get 30-45 minutes of regular exercise, no less than four to five days per week. Both weight-bearing and aerobic exercises are recommended.  She is advised to follow a healthy diet with at least six fruits/veggies per day, decrease intake of red meat and other saturated fats and to increase fish intake to twice weekly.  Meats/fish should not be fried -- baked, boiled or broiled is preferable. It is also important to cut back on your sugar intake.  Be sure to read labels - try to avoid anything with added sugar, high fructose corn syrup or other sweeteners.  If you must use a sweetener, you can try stevia or monkfruit.  It is also important to avoid artificially sweetened foods/beverages and diet drinks. Lastly, wear SPF 50 sunscreen on exposed skin and when in direct sunlight for an extended period of time.  Be sure to avoid fast food restaurants and aim for at least 60 ounces of water daily.    - Request pap smear results from Dr. Armond   Orders: -     CMP14+EGFR -     Lipid panel  Prediabetes Assessment & Plan: Prediabetes with previous metformin  use. Discussed dose adjustment for efficacy. Open to restarting metformin . Insurance does not cover alternatives, cost is a barrier. - Sent prescription for metformin . - Instructed to start metformin  at one daily for 7 days, then increase to one twice a day if tolerated. - Scheduled lab visit in 4 weeks to check kidney function and adjust metformin  dose if necessary.  Orders: -     Hemoglobin A1c  Class 3 severe obesity due to excess calories with body mass index (BMI) of 40.0 to 44.9 in adult, unspecified whether serious comorbidity present Assessment & Plan: Class 3 obesity with interest in weight loss  programs but financial concerns. Insurance does not cover weight loss medications. - Provided information on [facility] and [facility]. - Provided information on [facility] for weight management. - Included weight management options in after visit summary.    Return in 4 weeks (on 01/17/2024), or lab visit in four weeks, for 1 year HM, 4 month predm f/u.SABRA Patient was given opportunity to ask questions. Patient verbalized understanding of the plan and was able to repeat key elements of the plan. All questions were answered to their satisfaction.   I, [name], MD, have reviewed all documentation for this visit. The documentation on 12/20/23 for the exam, diagnosis, procedures, and orders are all accurate and complete.

## 2023-12-20 NOTE — Patient Instructions (Addendum)
 [facility]   Health Maintenance, Female Adopting a healthy lifestyle and getting preventive care are important in promoting health and wellness. Ask your health care provider about: The right schedule for you to have regular tests and exams. Things you can do on your own to prevent diseases and keep yourself healthy. What should I know about diet, weight, and exercise? Eat a healthy diet  Eat a diet that includes plenty of vegetables, fruits, low-fat dairy products, and lean protein. Do not eat a lot of foods that are high in solid fats, added sugars, or sodium. Maintain a healthy weight Body mass index (BMI) is used to identify weight problems. It estimates body fat based on height and weight. Your health care provider can help determine your BMI and help you achieve or maintain a healthy weight. Get regular exercise Get regular exercise. This is one of the most important things you can do for your health. Most adults should: Exercise for at least 150 minutes each week. The exercise should increase your heart rate and make you sweat (moderate-intensity exercise). Do strengthening exercises at least twice a week. This is in addition to the moderate-intensity exercise. Spend less time sitting. Even light physical activity can be beneficial. Watch cholesterol and blood lipids Have your blood tested for lipids and cholesterol at 37 years of age, then have this test every 5 years. Have your cholesterol levels checked more often if: Your lipid or cholesterol levels are high. You are older than 37 years of age. You are at high risk for heart disease. What should I know about cancer screening? Depending on your health history and family history, you may need to have cancer screening at various ages. This may include screening for: Breast cancer. Cervical cancer. Colorectal cancer. Skin cancer. Lung cancer. What should I know about heart disease, diabetes, and high blood  pressure? Blood pressure and heart disease High blood pressure causes heart disease and increases the risk of stroke. This is more likely to develop in people who have high blood pressure readings or are overweight. Have your blood pressure checked: Every 3-5 years if you are 58-10 years of age. Every year if you are 89 years old or older. Diabetes Have regular diabetes screenings. This checks your fasting blood sugar level. Have the screening done: Once every three years after age 62 if you are at a normal weight and have a low risk for diabetes. More often and at a younger age if you are overweight or have a high risk for diabetes. What should I know about preventing infection? Hepatitis B If you have a higher risk for hepatitis B, you should be screened for this virus. Talk with your health care provider to find out if you are at risk for hepatitis B infection. Hepatitis C Testing is recommended for: Everyone born from [birth year] through 1964. Anyone with known risk factors for hepatitis C. Sexually transmitted infections (STIs) Get screened for STIs, including gonorrhea and chlamydia, if: You are sexually active and are younger than 37 years of age. You are older than 37 years of age and your health care provider tells you that you are at risk for this type of infection. Your sexual activity has changed since you were last screened, and you are at increased risk for chlamydia or gonorrhea. Ask your health care provider if you are at risk. Ask your health care provider about whether you are at high risk for HIV. Your health care provider may  recommend a prescription medicine to help prevent HIV infection. If you choose to take medicine to prevent HIV, you should first get tested for HIV. You should then be tested every 3 months for as long as you are taking the medicine. Pregnancy If you are about to stop having your period (premenopausal) and you may become pregnant, seek counseling before you  get pregnant. Take 400 to 800 micrograms (mcg) of folic acid every day if you become pregnant. Ask for birth control (contraception) if you want to prevent pregnancy. Osteoporosis and menopause Osteoporosis is a disease in which the bones lose minerals and strength with aging. This can result in bone fractures. If you are 38 years old or older, or if you are at risk for osteoporosis and fractures, ask your health care provider if you should: Be screened for bone loss. Take a calcium or vitamin D  supplement to lower your risk of fractures. Be given hormone replacement therapy (HRT) to treat symptoms of menopause. Follow these instructions at home: Alcohol use Do not drink alcohol if: Your health care provider tells you not to drink. You are pregnant, may be pregnant, or are planning to become pregnant. If you drink alcohol: Limit how much you have to: 0-1 drink a day. Know how much alcohol is in your drink. In the [place] one drink equals one 12 oz bottle of beer (355 mL), one 5 oz glass of wine (148 mL), or one 1 oz glass of hard liquor (44 mL). Lifestyle Do not use any products that contain nicotine or tobacco. These products include cigarettes, chewing tobacco, and vaping devices, such as e-cigarettes. If you need help quitting, ask your health care provider. Do not use street drugs. Do not share needles. Ask your health care provider for help if you need support or information about quitting drugs. General instructions Schedule regular health, dental, and eye exams. Stay current with your vaccines. Tell your health care provider if: You often feel depressed. You have ever been abused or do not feel safe at home. Summary Adopting a healthy lifestyle and getting preventive care are important in promoting health and wellness. Follow your health care provider's instructions about healthy diet, exercising, and getting tested or screened for diseases. Follow your health care provider's  instructions on monitoring your cholesterol and blood pressure. This information is not intended to replace advice given to you by your health care provider. Make sure you discuss any questions you have with your health care provider. Document Revised: 08/17/2020 Document Reviewed: 08/17/2020 Elsevier Patient Education  2024 [employer].

## 2023-12-21 ENCOUNTER — Ambulatory Visit: Payer: Self-pay | Admitting: Internal Medicine

## 2023-12-21 LAB — LIPID PANEL
Chol/HDL Ratio: 4.4 ratio (ref 0.0–4.4)
Cholesterol, Total: 190 mg/dL (ref 100–199)
HDL: 43 mg/dL (ref >39)
LDL Chol Calc (NIH): 120 mg/dL — ABNORMAL HIGH (ref 0–99)
Triglycerides: 151 mg/dL — ABNORMAL HIGH (ref 0–149)
VLDL Cholesterol Cal: 27 mg/dL (ref 5–40)

## 2023-12-21 LAB — CMP14+EGFR
ALT: 17 IU/L (ref 0–32)
AST: 19 IU/L (ref 0–40)
Albumin: 4.3 g/dL (ref 3.9–4.9)
Alkaline Phosphatase: 50 IU/L (ref 44–121)
BUN/Creatinine Ratio: 8 — ABNORMAL LOW (ref 9–23)
BUN: 7 mg/dL (ref 6–20)
Bilirubin Total: 0.4 mg/dL (ref 0.0–1.2)
CO2: 20 mmol/L (ref 20–29)
Calcium: 9.4 mg/dL (ref 8.7–10.2)
Chloride: 105 mmol/L (ref 96–106)
Creatinine, Ser: 0.85 mg/dL (ref 0.57–1.00)
Globulin, Total: 2.6 g/dL (ref 1.5–4.5)
Glucose: 81 mg/dL (ref 70–99)
Potassium: 4.3 mmol/L (ref 3.5–5.2)
Sodium: 139 mmol/L (ref 134–144)
Total Protein: 6.9 g/dL (ref 6.0–8.5)
eGFR: 90 mL/min/1.73 (ref >59)

## 2023-12-21 LAB — HEMOGLOBIN A1C
Est. average glucose Bld gHb Est-mCnc: 120 mg/dL
Hgb A1c MFr Bld: 5.8 % — ABNORMAL HIGH (ref 4.8–5.6)

## 2023-12-30 NOTE — Assessment & Plan Note (Addendum)
 A full exam was performed.  Importance of monthly self breast exams was discussed with the patient.  She is advised to get 30-45 minutes of regular exercise, no less than four to five days per week. Both weight-bearing and aerobic exercises are recommended.  She is advised to follow a healthy diet with at least six fruits/veggies per day, decrease intake of red meat and other saturated fats and to increase fish intake to twice weekly.  Meats/fish should not be fried -- baked, boiled or broiled is preferable. It is also important to cut back on your sugar intake.  Be sure to read labels - try to avoid anything with added sugar, high fructose corn syrup or other sweeteners.  If you must use a sweetener, you can try stevia or monkfruit.  It is also important to avoid artificially sweetened foods/beverages and diet drinks. Lastly, wear SPF 50 sunscreen on exposed skin and when in direct sunlight for an extended period of time.  Be sure to avoid fast food restaurants and aim for at least 60 ounces of water daily.    - Request pap smear results from Dr. Armond

## 2023-12-30 NOTE — Assessment & Plan Note (Signed)
 Prediabetes with previous metformin  use. Discussed dose adjustment for efficacy. Open to restarting metformin . Insurance does not cover alternatives, cost is a barrier. - Sent prescription for metformin . - Instructed to start metformin  at one daily for 7 days, then increase to one twice a day if tolerated. - Scheduled lab visit in 4 weeks to check kidney function and adjust metformin  dose if necessary.

## 2023-12-30 NOTE — Assessment & Plan Note (Signed)
 Class 3 obesity with interest in weight loss programs but financial concerns. Insurance does not cover weight loss medications. - Provided information on [facility] and [facility]. - Provided information on [facility] for weight management. - Included weight management options in after visit summary.

## 2024-01-17 ENCOUNTER — Other Ambulatory Visit

## 2024-01-18 ENCOUNTER — Other Ambulatory Visit

## 2024-01-18 DIAGNOSIS — Z79899 Other long term (current) drug therapy: Secondary | ICD-10-CM

## 2024-01-18 LAB — CMP14+EGFR
ALT: 21 IU/L (ref 0–32)
AST: 14 IU/L (ref 0–40)
Albumin: 3.9 g/dL (ref 3.9–4.9)
Alkaline Phosphatase: 52 IU/L (ref 41–116)
BUN/Creatinine Ratio: 8 — ABNORMAL LOW (ref 9–23)
BUN: 8 mg/dL (ref 6–20)
Bilirubin Total: 0.7 mg/dL (ref 0.0–1.2)
CO2: 22 mmol/L (ref 20–29)
Calcium: 9.1 mg/dL (ref 8.7–10.2)
Chloride: 104 mmol/L (ref 96–106)
Creatinine, Ser: 0.95 mg/dL (ref 0.57–1.00)
Globulin, Total: 2.5 g/dL (ref 1.5–4.5)
Glucose: 94 mg/dL (ref 70–99)
Potassium: 4.2 mmol/L (ref 3.5–5.2)
Sodium: 138 mmol/L (ref 134–144)
Total Protein: 6.4 g/dL (ref 6.0–8.5)
eGFR: 79 mL/min/1.73 (ref >59)

## 2024-01-19 ENCOUNTER — Ambulatory Visit: Payer: Self-pay | Admitting: Internal Medicine

## 2024-04-22 ENCOUNTER — Ambulatory Visit: Payer: Self-pay | Admitting: Internal Medicine

## 2024-04-22 NOTE — Progress Notes (Unsigned)
 I,Johnte Portnoy T Emmitt, CMA,acting as a neurosurgeon for [name], MD.,have documented all relevant documentation on the behalf of [name], MD,as directed by  [name], MD while in the presence of [name], MD.  Subjective:  Patient ID: Linda Richards , female    DOB: 1985-04-14 , 39 y.o.   MRN: [id number]  No chief complaint on file.   HPI  HPI   Past Medical History:  Diagnosis Date   Family history of adverse reaction to anesthesia    patient states mother had reaction with surgery but can not remember what   UTI (urinary tract infection)      Family History  Problem Relation Age of Onset   Kidney disease Mother    Diabetes Mother    Kidney disease Father    Cancer Father        prostate   Diabetes Father    Hypertension Father    Cancer Paternal Grandmother     Current Medications[1]   Allergies[2]   Review of Systems  Constitutional: Negative.   Respiratory: Negative.    Cardiovascular: Negative.   Neurological: Negative.   Psychiatric/Behavioral: Negative.       There were no vitals filed for this visit. There is no height or weight on file to calculate BMI.  Wt Readings from Last 3 Encounters:  12/20/23 235 lb 12.8 oz (107 kg)  10/30/23 238 lb 6.4 oz (108.1 kg)  09/25/23 237 lb 12.8 oz (107.9 kg)    The ASCVD Risk score (Arnett DK, et al., 2019) failed to calculate for the following reasons:   The 2019 ASCVD risk score is only valid for ages 14 to 36  Objective:  Physical Exam      Assessment And Plan:   Assessment & Plan Prediabetes   No orders of the defined types were placed in this encounter.    No follow-ups on file.  Patient was given opportunity to ask questions. Patient verbalized understanding of the plan and was able to repeat key elements of the plan. All questions were answered to their satisfaction.    I, [name], MD, have reviewed all documentation for this visit. The documentation on 04/22/2024 for  the exam, diagnosis, procedures, and orders are all accurate and complete.   IF YOU HAVE BEEN REFERRED TO A SPECIALIST, IT MAY TAKE 1-2 WEEKS TO SCHEDULE/PROCESS THE REFERRAL. IF YOU HAVE NOT HEARD FROM US /SPECIALIST IN TWO WEEKS, PLEASE GIVE US  A CALL AT [phone] X [phone].     [1]  Current Outpatient Medications:    cyclobenzaprine  (FLEXERIL ) 5 MG tablet, Take 1 tablet (5 mg total) by mouth 3 (three) times daily as needed for muscle spasms. (Patient not taking: Reported on 12/20/2023), Disp: 30 tablet, Rfl: 0   metFORMIN  (GLUMETZA ) 500 MG (MOD) 24 hr tablet, TAKE 1 TABLET(500 MG) BY MOUTH DAILY WITH BREAKFAST (Patient not taking: Reported on 12/20/2023), Disp: 90 tablet, Rfl: 2   moxifloxacin  (VIGAMOX ) 0.5 % ophthalmic solution, Place 1 drop into both eyes 3 (three) times daily., Disp: 3 mL, Rfl: 0 [2]  Allergies Allergen Reactions   Penicillins Swelling

## 2024-04-22 NOTE — Patient Instructions (Incomplete)
 Prediabetes: What to Know Prediabetes is when your blood sugar, also called glucose, is at a higher level than normal but not high enough for you to be diagnosed with type 2 diabetes (type 2 diabetes mellitus). Having prediabetes puts you at risk for getting type 2 diabetes. By making some healthy changes, you may be able to prevent or delay getting type 2 diabetes. This is important because type 2 diabetes can lead to serious problems. Some of these include: Heart disease. Stroke. Blindness. Kidney disease. Depression. Poor blood flow in the feet and legs. In very bad cases, this could lead to having a leg removed by surgery (amputation). What are the causes? The exact cause of prediabetes isn't known. It may result from insulin resistance. Insulin resistance happens when cells in the body don't respond properly to insulin that the body makes. This can cause too much sugar to build up in the blood. High blood sugar, also called hyperglycemia, can develop. What increases the risk? Having a family member with type 2 diabetes. Being older than 38 years of age. Having had a temporary form of diabetes during a pregnancy. This is called gestational diabetes. Having had polycystic ovary syndrome (PCOS). Being overweight or obese. Being inactive and not getting much exercise. Having a history of heart disease. This may include problems with cholesterol levels, high levels of blood fats, or high blood pressure. What are the signs or symptoms? You may have no symptoms. If you do have symptoms, they may include: Increased hunger. Increased thirst. Needing to pee more often. Changes in how you see, like blurry vision. Feeling tired. How is this diagnosed? Prediabetes can be diagnosed with blood tests that check your blood sugar. One or more of these tests may be done: A fasting blood glucose (FBG) test. You won't be allowed to eat (you will fast) for at least 8 hours before a blood sample is  taken. An A1C blood test, also called a hemoglobin A1C test. This test shows information about blood sugar levels over the past 2?3 months. An oral glucose tolerance test (OGTT). This test measures your blood sugar at two points in time: After you haven't eaten for a while. This is your baseline level. Two hours after you drink a beverage that has sugar in it. You may be diagnosed with prediabetes if: Your FBG is 100?125 mg/dL (1.6-1.0 mmol/L). Your A1C level is 5.7?6.4% (39-46 mmol/mol). Your OGTT result is 140?199 mg/dL (9.6-04 mmol/L). These blood tests may need to be done again to be sure of the diagnosis. How is this treated? Treatment may include making changes to your diet and lifestyle. These changes can help lower your blood sugar and keep you from getting type 2 diabetes. In some cases, medicine may be given to help lower your risk. Follow these instructions at home: Eating and drinking  Eat and drink as told. Follow a healthy meal plan. This includes eating lean proteins, whole grains, legumes, fresh fruits and vegetables, low-fat dairy products, and healthy fats. Meet with an expert in healthy eating called a dietitian. This person can help create a healthy eating plan that's right for you. Lifestyle Do moderate-intensity exercise. Do this for at least 30 minutes a day on 5 or more days each week, or as told by your health care provider. A mix of activities may be best. Good choices include brisk walking, swimming, biking, and weight lifting. Try to lose weight if your provider says it's OK. Losing 5-7% of your body weight can  help reverse insulin resistance. Do not drink alcohol if: Your provider tells you not to drink. You're pregnant, may be pregnant, or plan to become pregnant. If you drink alcohol: Limit how much you have to: 0-1 drink a day if you're female. 0-2 drinks a day if you're female. Know how much alcohol is in your drink. In the [place] one drink is one 12 oz  bottle of beer (355 mL), one 5 oz glass of wine (148 mL), or one 1 oz glass of hard liquor (44 mL). General instructions Take medicines only as told. You may be given medicines that help lower the risk of type 2 diabetes. Do not smoke, vape, or use nicotine or tobacco. Where to find more information American Diabetes Association: diabetes.org/about-diabetes/prediabetes Academy of Nutrition and Dietetics: eatright.org American Heart Association: Go to ThisJobs.cz. Click the search icon. Type "prediabetes" in the search box. Contact a health care provider if: You have any of these symptoms: Increased hunger. Peeing more often than usual. Increased thirst. Feeling tired. Changes in how you see, like blurry vision. Feeling like you may throw up. Throwing up. Get help right away if: You have shortness of breath. You feel confused. This information is not intended to replace advice given to you by your health care provider. Make sure you discuss any questions you have with your health care provider. Document Revised: 10/30/2022 Document Reviewed: 10/30/2022 Elsevier Patient Education  2024 [employer].

## 2024-06-18 ENCOUNTER — Ambulatory Visit: Admitting: Internal Medicine

## 2024-12-26 ENCOUNTER — Encounter: Payer: Self-pay | Admitting: Internal Medicine
